# Patient Record
Sex: Male | Born: 1970 | Race: Black or African American | Hispanic: No | Smoking: Former smoker
Health system: Southern US, Community
[De-identification: ages and names within clinical notes are randomized; demographics above are authoritative.]

## PROBLEM LIST (undated history)

## (undated) DIAGNOSIS — W3400XA Accidental discharge from unspecified firearms or gun, initial encounter: Secondary | ICD-10-CM

## (undated) DIAGNOSIS — S81802A Unspecified open wound, left lower leg, initial encounter: Secondary | ICD-10-CM

## (undated) DIAGNOSIS — I10 Essential (primary) hypertension: Secondary | ICD-10-CM

## (undated) DIAGNOSIS — K219 Gastro-esophageal reflux disease without esophagitis: Secondary | ICD-10-CM

## (undated) DIAGNOSIS — G43909 Migraine, unspecified, not intractable, without status migrainosus: Secondary | ICD-10-CM

## (undated) DIAGNOSIS — G4730 Sleep apnea, unspecified: Secondary | ICD-10-CM

## (undated) HISTORY — PX: LEAD REMOVAL: SHX5944

---
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
## 2002-07-07 DIAGNOSIS — M25569 Pain in unspecified knee: Secondary | ICD-10-CM | POA: Insufficient documentation

## 2002-07-16 ENCOUNTER — Emergency Department (HOSPITAL_COMMUNITY): Admission: EM | Admit: 2002-07-16 | Discharge: 2002-07-16 | Payer: Self-pay | Admitting: Emergency Medicine

## 2003-07-08 ENCOUNTER — Emergency Department (HOSPITAL_COMMUNITY): Admission: EM | Admit: 2003-07-08 | Discharge: 2003-07-08 | Payer: Self-pay | Admitting: Emergency Medicine

## 2003-12-18 ENCOUNTER — Emergency Department (HOSPITAL_COMMUNITY): Admission: EM | Admit: 2003-12-18 | Discharge: 2003-12-18 | Payer: Self-pay | Admitting: Emergency Medicine

## 2005-03-10 ENCOUNTER — Emergency Department (HOSPITAL_COMMUNITY): Admission: EM | Admit: 2005-03-10 | Discharge: 2005-03-10 | Payer: Self-pay | Admitting: *Deleted

## 2005-10-09 ENCOUNTER — Emergency Department (HOSPITAL_COMMUNITY): Admission: EM | Admit: 2005-10-09 | Discharge: 2005-10-09 | Payer: Self-pay | Admitting: *Deleted

## 2005-11-30 ENCOUNTER — Emergency Department (HOSPITAL_COMMUNITY): Admission: EM | Admit: 2005-11-30 | Discharge: 2005-11-30 | Payer: Self-pay | Admitting: Emergency Medicine

## 2006-07-29 ENCOUNTER — Ambulatory Visit: Payer: Self-pay | Admitting: Internal Medicine

## 2006-08-05 ENCOUNTER — Ambulatory Visit: Payer: Self-pay | Admitting: Internal Medicine

## 2006-08-13 ENCOUNTER — Ambulatory Visit: Payer: Self-pay | Admitting: Internal Medicine

## 2006-09-03 ENCOUNTER — Ambulatory Visit: Payer: Self-pay | Admitting: Internal Medicine

## 2006-09-11 ENCOUNTER — Ambulatory Visit: Payer: Self-pay | Admitting: *Deleted

## 2006-11-17 ENCOUNTER — Ambulatory Visit: Payer: Self-pay | Admitting: Internal Medicine

## 2007-02-10 ENCOUNTER — Emergency Department (HOSPITAL_COMMUNITY): Admission: EM | Admit: 2007-02-10 | Discharge: 2007-02-10 | Payer: Self-pay | Admitting: Emergency Medicine

## 2007-03-18 ENCOUNTER — Telehealth (INDEPENDENT_AMBULATORY_CARE_PROVIDER_SITE_OTHER): Payer: Self-pay | Admitting: Internal Medicine

## 2007-03-24 ENCOUNTER — Encounter (INDEPENDENT_AMBULATORY_CARE_PROVIDER_SITE_OTHER): Payer: Self-pay | Admitting: *Deleted

## 2007-07-30 ENCOUNTER — Telehealth (INDEPENDENT_AMBULATORY_CARE_PROVIDER_SITE_OTHER): Payer: Self-pay | Admitting: Internal Medicine

## 2007-08-06 ENCOUNTER — Telehealth (INDEPENDENT_AMBULATORY_CARE_PROVIDER_SITE_OTHER): Payer: Self-pay | Admitting: Internal Medicine

## 2007-09-09 ENCOUNTER — Telehealth (INDEPENDENT_AMBULATORY_CARE_PROVIDER_SITE_OTHER): Payer: Self-pay | Admitting: Nurse Practitioner

## 2007-09-23 ENCOUNTER — Ambulatory Visit: Payer: Self-pay | Admitting: Internal Medicine

## 2007-09-23 DIAGNOSIS — G43909 Migraine, unspecified, not intractable, without status migrainosus: Secondary | ICD-10-CM | POA: Insufficient documentation

## 2007-09-23 DIAGNOSIS — M67919 Unspecified disorder of synovium and tendon, unspecified shoulder: Secondary | ICD-10-CM | POA: Insufficient documentation

## 2007-09-23 DIAGNOSIS — M719 Bursopathy, unspecified: Secondary | ICD-10-CM

## 2007-09-23 LAB — CONVERTED CEMR LAB
Bilirubin Urine: NEGATIVE
Ketones, urine, test strip: NEGATIVE
Urobilinogen, UA: 0.2
pH: 5.5

## 2007-09-27 ENCOUNTER — Telehealth (INDEPENDENT_AMBULATORY_CARE_PROVIDER_SITE_OTHER): Payer: Self-pay | Admitting: Internal Medicine

## 2007-09-27 ENCOUNTER — Encounter (INDEPENDENT_AMBULATORY_CARE_PROVIDER_SITE_OTHER): Payer: Self-pay | Admitting: *Deleted

## 2007-10-28 ENCOUNTER — Telehealth (INDEPENDENT_AMBULATORY_CARE_PROVIDER_SITE_OTHER): Payer: Self-pay | Admitting: Internal Medicine

## 2007-11-16 ENCOUNTER — Telehealth (INDEPENDENT_AMBULATORY_CARE_PROVIDER_SITE_OTHER): Payer: Self-pay | Admitting: *Deleted

## 2007-11-23 ENCOUNTER — Ambulatory Visit: Payer: Self-pay | Admitting: Internal Medicine

## 2007-11-24 ENCOUNTER — Encounter (INDEPENDENT_AMBULATORY_CARE_PROVIDER_SITE_OTHER): Payer: Self-pay | Admitting: Internal Medicine

## 2007-11-24 LAB — CONVERTED CEMR LAB
HDL: 51 mg/dL (ref >39)
LDL Cholesterol: 129 mg/dL — ABNORMAL HIGH (ref 0–99)

## 2007-11-25 ENCOUNTER — Encounter (INDEPENDENT_AMBULATORY_CARE_PROVIDER_SITE_OTHER): Payer: Self-pay | Admitting: Internal Medicine

## 2007-12-31 ENCOUNTER — Ambulatory Visit: Payer: Self-pay | Admitting: Internal Medicine

## 2007-12-31 DIAGNOSIS — E7800 Pure hypercholesterolemia, unspecified: Secondary | ICD-10-CM

## 2007-12-31 DIAGNOSIS — R5383 Other fatigue: Secondary | ICD-10-CM

## 2007-12-31 DIAGNOSIS — F341 Dysthymic disorder: Secondary | ICD-10-CM

## 2007-12-31 DIAGNOSIS — R0789 Other chest pain: Secondary | ICD-10-CM | POA: Insufficient documentation

## 2007-12-31 DIAGNOSIS — R5381 Other malaise: Secondary | ICD-10-CM

## 2007-12-31 DIAGNOSIS — B360 Pityriasis versicolor: Secondary | ICD-10-CM | POA: Insufficient documentation

## 2007-12-31 DIAGNOSIS — I517 Cardiomegaly: Secondary | ICD-10-CM

## 2008-01-04 ENCOUNTER — Encounter (INDEPENDENT_AMBULATORY_CARE_PROVIDER_SITE_OTHER): Payer: Self-pay | Admitting: Internal Medicine

## 2008-01-04 ENCOUNTER — Ambulatory Visit: Payer: Self-pay | Admitting: Internal Medicine

## 2008-01-04 ENCOUNTER — Ambulatory Visit (HOSPITAL_COMMUNITY): Admission: RE | Admit: 2008-01-04 | Discharge: 2008-01-04 | Payer: Self-pay | Admitting: Internal Medicine

## 2008-01-20 ENCOUNTER — Encounter (INDEPENDENT_AMBULATORY_CARE_PROVIDER_SITE_OTHER): Payer: Self-pay | Admitting: Internal Medicine

## 2008-03-09 ENCOUNTER — Encounter (INDEPENDENT_AMBULATORY_CARE_PROVIDER_SITE_OTHER): Payer: Self-pay | Admitting: *Deleted

## 2008-03-15 ENCOUNTER — Telehealth (INDEPENDENT_AMBULATORY_CARE_PROVIDER_SITE_OTHER): Payer: Self-pay | Admitting: Internal Medicine

## 2008-03-22 ENCOUNTER — Telehealth (INDEPENDENT_AMBULATORY_CARE_PROVIDER_SITE_OTHER): Payer: Self-pay | Admitting: Internal Medicine

## 2008-04-07 ENCOUNTER — Encounter (INDEPENDENT_AMBULATORY_CARE_PROVIDER_SITE_OTHER): Payer: Self-pay | Admitting: Internal Medicine

## 2008-06-06 ENCOUNTER — Ambulatory Visit: Payer: Self-pay | Admitting: Internal Medicine

## 2008-08-08 ENCOUNTER — Telehealth (INDEPENDENT_AMBULATORY_CARE_PROVIDER_SITE_OTHER): Payer: Self-pay | Admitting: Internal Medicine

## 2008-09-15 ENCOUNTER — Telehealth (INDEPENDENT_AMBULATORY_CARE_PROVIDER_SITE_OTHER): Payer: Self-pay | Admitting: Internal Medicine

## 2008-09-30 ENCOUNTER — Emergency Department (HOSPITAL_COMMUNITY): Admission: EM | Admit: 2008-09-30 | Discharge: 2008-09-30 | Payer: Self-pay | Admitting: Emergency Medicine

## 2009-03-26 ENCOUNTER — Emergency Department (HOSPITAL_COMMUNITY): Admission: EM | Admit: 2009-03-26 | Discharge: 2009-03-26 | Payer: Self-pay | Admitting: Emergency Medicine

## 2009-03-27 ENCOUNTER — Telehealth (INDEPENDENT_AMBULATORY_CARE_PROVIDER_SITE_OTHER): Payer: Self-pay | Admitting: *Deleted

## 2009-03-28 ENCOUNTER — Ambulatory Visit: Payer: Self-pay | Admitting: Internal Medicine

## 2009-03-28 LAB — CONVERTED CEMR LAB
AST: 17 units/L (ref 0–37)
Albumin: 4.5 g/dL (ref 3.5–5.2)
Alkaline Phosphatase: 53 units/L (ref 39–117)
BUN: 12 mg/dL (ref 6–23)
Basophils Absolute: 0 10*3/uL (ref 0.0–0.1)
Chloride: 105 meq/L (ref 96–112)
Creatinine, Ser: 0.93 mg/dL (ref 0.40–1.50)
HCT: 43.8 % (ref 39.0–52.0)
Hemoglobin: 14.9 g/dL (ref 13.0–17.0)
Lymphocytes Relative: 39 % (ref 12–46)
Lymphs Abs: 3 10*3/uL (ref 0.7–4.0)
MCHC: 34 g/dL (ref 30.0–36.0)
MCV: 91.8 fL (ref 78.0–100.0)
Monocytes Relative: 7 % (ref 3–12)
Neutro Abs: 4 10*3/uL (ref 1.7–7.7)
Neutrophils Relative %: 52 % (ref 43–77)
Platelets: 247 10*3/uL (ref 150–400)
Potassium: 4.5 meq/L (ref 3.5–5.3)
RBC: 4.77 M/uL (ref 4.22–5.81)
RDW: 13.2 % (ref 11.5–15.5)
Sodium: 142 meq/L (ref 135–145)

## 2009-04-17 ENCOUNTER — Encounter (INDEPENDENT_AMBULATORY_CARE_PROVIDER_SITE_OTHER): Payer: Self-pay | Admitting: Internal Medicine

## 2009-12-12 ENCOUNTER — Emergency Department (HOSPITAL_COMMUNITY): Admission: EM | Admit: 2009-12-12 | Discharge: 2009-12-12 | Payer: Self-pay | Admitting: Emergency Medicine

## 2010-05-29 ENCOUNTER — Encounter (INDEPENDENT_AMBULATORY_CARE_PROVIDER_SITE_OTHER): Payer: Self-pay | Admitting: Internal Medicine

## 2010-07-06 ENCOUNTER — Emergency Department (HOSPITAL_COMMUNITY)
Admission: EM | Admit: 2010-07-06 | Discharge: 2010-07-06 | Payer: Self-pay | Source: Home / Self Care | Admitting: Family Medicine

## 2010-08-04 LAB — CONVERTED CEMR LAB
Albumin: 4.5 g/dL (ref 3.5–5.2)
Alkaline Phosphatase: 56 units/L (ref 39–117)
Bilirubin Urine: NEGATIVE
Blood in Urine, dipstick: NEGATIVE
Chloride: 107 meq/L (ref 96–112)
Eosinophils Absolute: 0.1 10*3/uL (ref 0.0–0.7)
Eosinophils Relative: 2 % (ref 0–5)
Hemoglobin: 15 g/dL (ref 13.0–17.0)
Lymphs Abs: 2.7 10*3/uL (ref 0.7–4.0)
MCHC: 32.6 g/dL (ref 30.0–36.0)
Monocytes Absolute: 0.6 10*3/uL (ref 0.1–1.0)
Monocytes Relative: 9 % (ref 3–12)
Neutro Abs: 3 10*3/uL (ref 1.7–7.7)
Nitrite: NEGATIVE
Protein, U semiquant: NEGATIVE
RBC: 4.85 M/uL (ref 4.22–5.81)
Specific Gravity, Urine: >=1.03
TSH: 1.753 microintl units/mL (ref 0.350–5.50)
Total Bilirubin: 0.4 mg/dL (ref 0.3–1.2)
Total Protein: 7.3 g/dL (ref 6.0–8.3)
Urobilinogen, UA: 0.2
WBC Urine, dipstick: NEGATIVE

## 2010-08-06 NOTE — Letter (Signed)
Summary: FAXED REQUESTED RECORDS TO [facility]  FAXED REQUESTED RECORDS TO [facility]   Imported By: Arta Bruce 05/29/2010 16:42:45  _____________________________________________________________________  External Attachment:    Type:   Image     Comment:   External Document

## 2010-08-30 ENCOUNTER — Emergency Department (HOSPITAL_COMMUNITY)
Admission: EM | Admit: 2010-08-30 | Discharge: 2010-08-30 | Disposition: A | Discharge status: Hospice | Payer: Self-pay | Attending: Emergency Medicine | Admitting: Emergency Medicine

## 2010-08-30 ENCOUNTER — Emergency Department (HOSPITAL_COMMUNITY): Payer: Self-pay

## 2010-08-30 DIAGNOSIS — W108XXA Fall (on) (from) other stairs and steps, initial encounter: Secondary | ICD-10-CM | POA: Insufficient documentation

## 2010-08-30 DIAGNOSIS — R071 Chest pain on breathing: Secondary | ICD-10-CM | POA: Insufficient documentation

## 2010-08-30 DIAGNOSIS — S20219A Contusion of unspecified front wall of thorax, initial encounter: Secondary | ICD-10-CM | POA: Insufficient documentation

## 2010-08-30 DIAGNOSIS — S298XXA Other specified injuries of thorax, initial encounter: Secondary | ICD-10-CM | POA: Insufficient documentation

## 2010-08-30 DIAGNOSIS — F329 Major depressive disorder, single episode, unspecified: Secondary | ICD-10-CM | POA: Insufficient documentation

## 2010-08-30 DIAGNOSIS — Z79899 Other long term (current) drug therapy: Secondary | ICD-10-CM | POA: Insufficient documentation

## 2010-08-30 DIAGNOSIS — F3289 Other specified depressive episodes: Secondary | ICD-10-CM | POA: Insufficient documentation

## 2010-08-30 DIAGNOSIS — R0602 Shortness of breath: Secondary | ICD-10-CM | POA: Insufficient documentation

## 2010-08-30 DIAGNOSIS — I10 Essential (primary) hypertension: Secondary | ICD-10-CM | POA: Insufficient documentation

## 2010-09-23 LAB — GC/CHLAMYDIA PROBE AMP, GENITAL: Chlamydia, DNA Probe: POSITIVE — AB

## 2010-10-16 ENCOUNTER — Inpatient Hospital Stay (INDEPENDENT_AMBULATORY_CARE_PROVIDER_SITE_OTHER)
Admission: RE | Admit: 2010-10-16 | Discharge: 2010-10-16 | Disposition: A | Discharge status: Home / Self Care | Payer: Self-pay | Source: Ambulatory Visit | Attending: Family Medicine | Admitting: Family Medicine

## 2010-10-16 DIAGNOSIS — A64 Unspecified sexually transmitted disease: Secondary | ICD-10-CM

## 2010-10-16 DIAGNOSIS — I10 Essential (primary) hypertension: Secondary | ICD-10-CM

## 2010-10-17 LAB — POCT I-STAT, CHEM 8
BUN: 14 mg/dL (ref 6–23)
Calcium, Ion: 1.13 mmol/L (ref 1.12–1.32)
Glucose, Bld: 86 mg/dL (ref 70–99)
HCT: 47 % (ref 39.0–52.0)
TCO2: 26 mmol/L (ref 0–100)

## 2010-10-17 LAB — URINALYSIS, ROUTINE W REFLEX MICROSCOPIC
Hgb urine dipstick: NEGATIVE
Ketones, ur: 15 mg/dL — AB
Protein, ur: NEGATIVE mg/dL
Urobilinogen, UA: 1 mg/dL (ref 0.0–1.0)
pH: 5.5 (ref 5.0–8.0)

## 2010-10-17 LAB — CBC
MCV: 94.4 fL (ref 78.0–100.0)
Platelets: 220 10*3/uL (ref 150–400)
RDW: 13 % (ref 11.5–15.5)
WBC: 16.6 10*3/uL — ABNORMAL HIGH (ref 4.0–10.5)

## 2010-10-17 LAB — DIFFERENTIAL
Basophils Relative: 1 % (ref 0–1)
Lymphocytes Relative: 15 % (ref 12–46)
Lymphs Abs: 2.5 10*3/uL (ref 0.7–4.0)
Neutro Abs: 12.9 10*3/uL — ABNORMAL HIGH (ref 1.7–7.7)
Neutrophils Relative %: 78 % — ABNORMAL HIGH (ref 43–77)

## 2010-10-17 LAB — GC/CHLAMYDIA PROBE AMP, GENITAL: GC Probe Amp, Genital: NEGATIVE

## 2010-12-19 ENCOUNTER — Emergency Department (HOSPITAL_COMMUNITY)
Admission: EM | Admit: 2010-12-19 | Discharge: 2010-12-19 | Discharge status: Against Medical Advice | Payer: Self-pay | Attending: Emergency Medicine | Admitting: Emergency Medicine

## 2010-12-19 DIAGNOSIS — Z0389 Encounter for observation for other suspected diseases and conditions ruled out: Secondary | ICD-10-CM | POA: Insufficient documentation

## 2011-03-04 ENCOUNTER — Emergency Department (HOSPITAL_COMMUNITY): Payer: Self-pay

## 2011-03-04 ENCOUNTER — Emergency Department (HOSPITAL_COMMUNITY)
Admission: EM | Admit: 2011-03-04 | Discharge: 2011-03-04 | Disposition: A | Discharge status: Home / Self Care | Payer: Self-pay | Attending: Emergency Medicine | Admitting: Emergency Medicine

## 2011-03-04 DIAGNOSIS — I10 Essential (primary) hypertension: Secondary | ICD-10-CM | POA: Insufficient documentation

## 2011-03-04 DIAGNOSIS — R079 Chest pain, unspecified: Secondary | ICD-10-CM | POA: Insufficient documentation

## 2011-03-04 DIAGNOSIS — R51 Headache: Secondary | ICD-10-CM | POA: Insufficient documentation

## 2011-03-04 LAB — POCT I-STAT, CHEM 8
BUN: 14 mg/dL (ref 6–23)
Glucose, Bld: 75 mg/dL (ref 70–99)
HCT: 45 % (ref 39.0–52.0)
Hemoglobin: 15.3 g/dL (ref 13.0–17.0)

## 2011-07-07 ENCOUNTER — Encounter: Payer: Self-pay | Admitting: Emergency Medicine

## 2011-07-07 ENCOUNTER — Emergency Department (HOSPITAL_COMMUNITY)
Admission: EM | Admit: 2011-07-07 | Discharge: 2011-07-07 | Disposition: A | Discharge status: Home / Self Care | Payer: Self-pay | Attending: Emergency Medicine | Admitting: Emergency Medicine

## 2011-07-07 DIAGNOSIS — F17200 Nicotine dependence, unspecified, uncomplicated: Secondary | ICD-10-CM | POA: Insufficient documentation

## 2011-07-07 DIAGNOSIS — R51 Headache: Secondary | ICD-10-CM | POA: Insufficient documentation

## 2011-07-07 DIAGNOSIS — R112 Nausea with vomiting, unspecified: Secondary | ICD-10-CM | POA: Insufficient documentation

## 2011-07-07 HISTORY — DX: Migraine, unspecified, not intractable, without status migrainosus: G43.909

## 2011-07-07 MED ORDER — SODIUM CHLORIDE 0.9 % IV BOLUS (SEPSIS)
500.0000 mL | Freq: Once | INTRAVENOUS | Status: AC
  Administered 2011-07-07: 500 mL via INTRAVENOUS

## 2011-07-07 MED ORDER — METOCLOPRAMIDE HCL 5 MG/ML IJ SOLN
10.0000 mg | Freq: Once | INTRAMUSCULAR | Status: AC
  Administered 2011-07-07: 10 mg via INTRAVENOUS
  Filled 2011-07-07: qty 2

## 2011-07-07 MED ORDER — DEXAMETHASONE SODIUM PHOSPHATE 10 MG/ML IJ SOLN
10.0000 mg | Freq: Once | INTRAMUSCULAR | Status: AC
  Administered 2011-07-07: 10 mg via INTRAVENOUS
  Filled 2011-07-07: qty 1

## 2011-07-07 MED ORDER — VERAPAMIL HCL ER 120 MG PO TBCR: 120.0000 mg | EXTENDED_RELEASE_TABLET | Freq: Every day | ORAL | Status: DC

## 2011-07-07 MED ORDER — DIPHENHYDRAMINE HCL 50 MG/ML IJ SOLN
25.0000 mg | Freq: Once | INTRAMUSCULAR | Status: AC
  Administered 2011-07-07: 25 mg via INTRAVENOUS
  Filled 2011-07-07: qty 1

## 2011-07-07 NOTE — ED Provider Notes (Signed)
History     CSN: [id number]  Arrival date & time 07/07/11  0156   First MD Initiated Contact with Patient 07/07/11 0250      Chief Complaint  Patient presents with  . Headache     Patient is a 40 y.o. male presenting with headaches. The history is provided by the patient.  Headache  This is a recurrent problem. The current episode started 6 to 12 hours ago. The problem occurs constantly. The problem has been gradually worsening. The headache is associated with nothing. The pain is located in the frontal region. The quality of the pain is described as throbbing. The pain is moderate. Associated symptoms include nausea and vomiting. Pertinent negatives include no fever and no syncope.  HA was not sudden onset Similar to prior episodes of migraines No fever No focal weakness No cp No visual/hearing loss No head/neck injury No one else at home has these symptoms  Past Medical History  Diagnosis Date  . Migraines   . Cancer     No past surgical history on file.  History reviewed. No pertinent family history.  History  Substance Use Topics  . Smoking status: Current Everyday Smoker  . Smokeless tobacco: Not on file  . Alcohol Use: Yes      Review of Systems  Constitutional: Negative for fever.  Cardiovascular: Negative for syncope.  Gastrointestinal: Positive for nausea and vomiting.  Neurological: Positive for headaches.  All other systems reviewed and are negative.    Allergies  Penicillins  Home Medications  No current outpatient prescriptions on file.  BP 141/73  Pulse 79  Temp(Src) 97.8 F (36.6 C) (Oral)  Resp 17  Ht 5\' 6"  (1.676 m)  Wt 165 lb (74.844 kg)  BMI 26.63 kg/m2  SpO2 100%  Physical Exam CONSTITUTIONAL: Well developed/well nourished HEAD AND FACE: Normocephalic/atraumatic EYES: EOMI/PERRL, normal appearance ENMT: Mucous membranes moist NECK: supple no meningeal signs, no bruits SPINE:entire spine nontender CV: S1/S2 noted, no  murmurs/rubs/gallops noted LUNGS: Lungs are clear to auscultation bilaterally, no apparent distress ABDOMEN: soft, nontender, no rebound or guarding GU:no cva tenderness NEURO: Awake/alert, facies symmetric, no arm or leg drift is noted Cranial nerves 3/4/5/6/01/12/09/11/12 tested and intact Gait normal No pastpointing EXTREMITIES: pulses normal, full ROM SKIN: warm, color normal PSYCH: no abnormalities of mood noted  ED Course  Procedures     MDM  Nursing notes reviewed and considered in documentation Previous records reviewed and considered  Pt has had negative CT head previously Doubt SAH or other acute neurologic event He is nontoxic in appearance  4:27 AM Pt improved, stable for d/c      Joya Gaskins, MD 07/07/11 [phone]

## 2011-07-07 NOTE — ED Notes (Signed)
Patient states that at yesterday at 3 he started having a headache and vomiting x3 also states no BM in approx 2 weeks rates 8/10

## 2012-03-01 ENCOUNTER — Emergency Department (HOSPITAL_COMMUNITY)
Admission: EM | Admit: 2012-03-01 | Discharge: 2012-03-01 | Disposition: A | Discharge status: Home / Self Care | Payer: Self-pay | Attending: Emergency Medicine | Admitting: Emergency Medicine

## 2012-03-01 ENCOUNTER — Encounter (HOSPITAL_COMMUNITY): Payer: Self-pay | Admitting: Emergency Medicine

## 2012-03-01 DIAGNOSIS — F17200 Nicotine dependence, unspecified, uncomplicated: Secondary | ICD-10-CM | POA: Insufficient documentation

## 2012-03-01 DIAGNOSIS — I10 Essential (primary) hypertension: Secondary | ICD-10-CM | POA: Insufficient documentation

## 2012-03-01 DIAGNOSIS — L089 Local infection of the skin and subcutaneous tissue, unspecified: Secondary | ICD-10-CM | POA: Insufficient documentation

## 2012-03-01 HISTORY — DX: Essential (primary) hypertension: I10

## 2012-03-01 MED ORDER — SULFAMETHOXAZOLE-TRIMETHOPRIM 800-160 MG PO TABS: 2.0000 | ORAL_TABLET | Freq: Two times a day (BID) | ORAL | Status: AC

## 2012-03-01 MED ORDER — IBUPROFEN 800 MG PO TABS: 800.0000 mg | ORAL_TABLET | Freq: Three times a day (TID) | ORAL | Status: AC | PRN

## 2012-03-01 NOTE — ED Provider Notes (Signed)
Medical screening examination/treatment/procedure(s) were performed by non-physician practitioner and as supervising physician I was immediately available for consultation/collaboration.   Gavin Pound. Erland Vivas, MD 03/01/12 1517

## 2012-03-01 NOTE — ED Provider Notes (Signed)
History     CSN: [id number]  Arrival date & time 03/01/12  1020   First MD Initiated Contact with Patient 03/01/12 1106      Chief Complaint  Patient presents with  . Toe Injury    (Consider location/radiation/quality/duration/timing/severity/associated sxs/prior treatment) HPI Comments: Patient reports he was at a water park 2 days ago and was sliding down a water slide and his right second toenail got caught and was lifted up, partially separating from his toe.   States that he pushed the toenail down, cleaned the toe with iodine, and wrapped it up.  States it began throbbing yesterday and pus started coming out from under the toenail last night.  Pain is exacerbated by ambulation and palpation.  States he took [facility] powders for the pain but does not know if it helped because he was also drinking alcohol.  Denies fevers, chills, leg swelling.  Denies other injury.  No weakness or numbness of the toe.  Pt is not diabetic, is not immunocompromised.    The history is provided by the patient.    Past Medical History  Diagnosis Date  . Migraines   . Cancer   . Hypertension     History reviewed. No pertinent past surgical history.  History reviewed. No pertinent family history.  History  Substance Use Topics  . Smoking status: Current Everyday Smoker  . Smokeless tobacco: Not on file  . Alcohol Use: Yes      Review of Systems  Constitutional: Negative for fever and chills.  Skin: Positive for wound.  Neurological: Negative for weakness and numbness.    Allergies  Penicillins  Home Medications   Current Outpatient Rx  Name Route Sig Dispense Refill  . OVER THE COUNTER MEDICATION Oral Take 1 Package by mouth once as needed. For pain  BC Powder      BP 142/89  Pulse 80  Temp 97.7 F (36.5 C) (Oral)  Resp 16  SpO2 96%  Physical Exam  Nursing note and vitals reviewed. Constitutional: He appears well-developed and well-nourished. No distress.  HENT:  Head:  Normocephalic and atraumatic.  Neck: Neck supple.  Pulmonary/Chest: Effort normal.  Musculoskeletal:       Feet:  Neurological: He is alert.  Skin: He is not diaphoretic.    ED Course  Procedures (including critical care time)  Labs Reviewed - No data to display No results found.   1. Toe infection       MDM  Pt with infection involving right second toe and toenail.  Pt is afebrile, nontoxic, no areas of fluctuance within toe.  Toenail is attached, no subungual hematoma.  Pt is immunocompetent.  Chart initially noted Cancer as part of medical history - patient denies this.  Pt d/c home with bactrim, 2 day recheck.  Discussed diagnosis, wound care, and follow up with patient.  Pt given return precautions.  Pt verbalizes understanding and agrees with plan.           [place], [place] 03/01/12 1219

## 2012-03-01 NOTE — ED Notes (Signed)
Pt c/o right second toe injury where toe nail lifted up over the weekend; pt sts purulent discharge coming from toe now

## 2012-03-01 NOTE — ED Notes (Signed)
Reports was caught toe on something going down a waterslide on Saturday & that his toe nail came off. States placed it back on. Edema & bruising noted

## 2012-04-13 ENCOUNTER — Encounter (HOSPITAL_COMMUNITY): Payer: Self-pay

## 2012-04-13 ENCOUNTER — Emergency Department (HOSPITAL_COMMUNITY)
Admission: EM | Admit: 2012-04-13 | Discharge: 2012-04-13 | Disposition: A | Discharge status: Home / Self Care | Payer: Self-pay | Attending: Emergency Medicine | Admitting: Emergency Medicine

## 2012-04-13 DIAGNOSIS — R51 Headache: Secondary | ICD-10-CM | POA: Insufficient documentation

## 2012-04-13 NOTE — ED Notes (Signed)
Called for pt x1 at 1530

## 2012-04-13 NOTE — ED Notes (Signed)
Pt complains of headache for 4 days, and body feels tired.

## 2012-04-13 NOTE — ED Notes (Signed)
Called for pt x2 at 1600. No answer.

## 2012-04-14 ENCOUNTER — Emergency Department (INDEPENDENT_AMBULATORY_CARE_PROVIDER_SITE_OTHER)
Admission: EM | Admit: 2012-04-14 | Discharge: 2012-04-14 | Discharge status: Against Medical Advice | Payer: Self-pay | Source: Home / Self Care | Attending: Emergency Medicine | Admitting: Emergency Medicine

## 2012-04-14 ENCOUNTER — Encounter (HOSPITAL_COMMUNITY): Payer: Self-pay | Admitting: Emergency Medicine

## 2012-04-14 DIAGNOSIS — I10 Essential (primary) hypertension: Secondary | ICD-10-CM | POA: Insufficient documentation

## 2012-04-14 DIAGNOSIS — R51 Headache: Secondary | ICD-10-CM

## 2012-04-14 DIAGNOSIS — R0602 Shortness of breath: Secondary | ICD-10-CM | POA: Insufficient documentation

## 2012-04-14 NOTE — ED Notes (Signed)
Patient complaining of hypertension, headache, and shortness of breath the last several days.  Patient reports that he came to the ED several days ago, but left AMA.  Patient was seen at Urgent Care tonight; Urgent Care wanted patient to come to ED, but patient refused.  Patient's family talked him into coming back to ED.  Patient has history of hypertension; patient reports that he has not had his blood pressure medication for the last year due to no insurance.  Denies dizziness and blurred vision.

## 2012-04-14 NOTE — ED Provider Notes (Signed)
History     CSN: [id number]  Arrival date & time 04/14/12  1903   First MD Initiated Contact with Patient 04/14/12 2010      No chief complaint on file.   (Consider location/radiation/quality/duration/timing/severity/associated sxs/prior treatment) The history is provided by the patient.  Patient complains of a 2 week history of persistent headache. Character:  Throbbing Location: behind eyes Aggravating activities:  Standing, walking Alleviating activities:  Supine, quiet No history of injury.  Not worst headache of life.  + awaken from sleep.  No radiation, +dizziness, -syncope, -LOC, +diplopia, loss of vision, aura, +photophobia. +nausea, no vomiting.  No extremity weakness, numbness, or paresthesias Known history of both migraines and hypertension.  Does not take medication for either for over one year.  Home migraine medication taken with minimal improvement. Headache at worst 8/10 decreased to 5/10. Past Medical History  Diagnosis Date  . Migraines   . Hypertension     No past surgical history on file.  No family history on file.  History  Substance Use Topics  . Smoking status: Current Every Day Smoker  . Smokeless tobacco: Not on file  . Alcohol Use: Yes      Review of Systems  Constitutional: Negative.   Eyes: Positive for photophobia and visual disturbance.  Respiratory: Positive for shortness of breath.   Cardiovascular: Positive for palpitations.  Gastrointestinal: Positive for nausea.  Genitourinary: Negative.   Musculoskeletal: Negative.   Skin: Negative.   Neurological: Positive for dizziness and headaches. Negative for syncope, speech difficulty, weakness and numbness.    Allergies  Penicillins  Home Medications    BP 153/108  Pulse 68  Temp 98.6 F (37 C) (Oral)  Resp 17  SpO2 100%  Physical Exam  Nursing note and vitals reviewed. Constitutional: He is oriented to person, place, and time. Vital signs are normal. He appears  well-developed and well-nourished. He is active and cooperative.  HENT:  Head: Normocephalic.  Right Ear: Hearing, tympanic membrane, external ear and ear canal normal.  Left Ear: Hearing, tympanic membrane, external ear and ear canal normal.  Nose: Nose normal. Right sinus exhibits no maxillary sinus tenderness and no frontal sinus tenderness. Left sinus exhibits no maxillary sinus tenderness and no frontal sinus tenderness.  Mouth/Throat: Uvula is midline, oropharynx is clear and moist and mucous membranes are normal. No oropharyngeal exudate.  Eyes: Conjunctivae normal and EOM are normal. Pupils are equal, round, and reactive to light. Right eye exhibits no chemosis, no discharge, no exudate and no hordeolum. No foreign body present in the right eye. Left eye exhibits no chemosis, no discharge, no exudate and no hordeolum. No foreign body present in the left eye. No scleral icterus.       Painful EOM, no pastpointing  Neck: Trachea normal, normal range of motion and full passive range of motion without pain. Neck supple. No spinous process tenderness and no muscular tenderness present. Carotid bruit is not present. No thyromegaly present.  Cardiovascular: Normal rate, regular rhythm, S1 normal, normal heart sounds, intact distal pulses and normal pulses.   Pulmonary/Chest: Effort normal and breath sounds normal.  Abdominal: Soft. Normal appearance and bowel sounds are normal. There is no tenderness. There is no rebound and no CVA tenderness.  Musculoskeletal: Normal range of motion.  Lymphadenopathy:       Head (right side): No submental, no submandibular, no tonsillar, no preauricular, no posterior auricular and no occipital adenopathy present.       Head (left side): No submental, no  submandibular, no tonsillar, no preauricular, no posterior auricular and no occipital adenopathy present.    He has no cervical adenopathy.  Neurological: He is alert and oriented to person, place, and time. He  has normal strength and normal reflexes. No cranial nerve deficit or sensory deficit. Coordination and gait normal. GCS eye subscore is 4. GCS verbal subscore is 5. GCS motor subscore is 6.  Skin: Skin is warm and dry.  Psychiatric: He has a normal mood and affect. His speech is normal and behavior is normal. Judgment and thought content normal. Cognition and memory are normal.    ED Course  Procedures (including critical care time)  Labs Reviewed - No data to display   1. Headache   2. Hypertension       MDM  Transfer to [facility] for further evaluation and treatment of headache and further evaluation elevated htn. Patient does not have primary care provider.  Pt refused transfer, reports he does not wish to go to ER for long wait.        Johnsie Kindred, NP 04/15/12 [phone]

## 2012-04-14 NOTE — ED Notes (Signed)
NP states pt. left approx. 2035.

## 2012-04-14 NOTE — ED Notes (Addendum)
NP in room with pt. when I went to assess them.  When I came back now, pt. is not in room.  I was told pt. refused to go to the ED as the NP suggested.   I was told he became upset. He wanted prescriptions for his high blood pressure. Pt. Left AMA.

## 2012-04-15 ENCOUNTER — Emergency Department (HOSPITAL_COMMUNITY): Payer: Self-pay

## 2012-04-15 ENCOUNTER — Emergency Department (HOSPITAL_COMMUNITY)
Admission: EM | Admit: 2012-04-15 | Discharge: 2012-04-15 | Disposition: A | Discharge status: Home / Self Care | Payer: Self-pay | Attending: Emergency Medicine | Admitting: Emergency Medicine

## 2012-04-15 DIAGNOSIS — R51 Headache: Secondary | ICD-10-CM

## 2012-04-15 DIAGNOSIS — I10 Essential (primary) hypertension: Secondary | ICD-10-CM

## 2012-04-15 DIAGNOSIS — R0602 Shortness of breath: Secondary | ICD-10-CM

## 2012-04-15 LAB — COMPREHENSIVE METABOLIC PANEL
ALT: 17 U/L (ref 0–53)
AST: 21 U/L (ref 0–37)
Albumin: 4.7 g/dL (ref 3.5–5.2)
Alkaline Phosphatase: 60 U/L (ref 39–117)
BUN: 11 mg/dL (ref 6–23)
CO2: 25 mEq/L (ref 19–32)
Calcium: 10.2 mg/dL (ref 8.4–10.5)
Chloride: 101 mEq/L (ref 96–112)
Creatinine, Ser: 0.97 mg/dL (ref 0.50–1.35)
GFR calc Af Amer: >90 mL/min (ref >90)
GFR calc non Af Amer: >90 mL/min (ref >90)
Glucose, Bld: 90 mg/dL (ref 70–99)
Potassium: 3.3 mEq/L — ABNORMAL LOW (ref 3.5–5.1)
Sodium: 138 mEq/L (ref 135–145)
Total Bilirubin: 0.4 mg/dL (ref 0.3–1.2)
Total Protein: 8 g/dL (ref 6.0–8.3)

## 2012-04-15 LAB — CBC WITH DIFFERENTIAL/PLATELET
Basophils Absolute: 0 10*3/uL (ref 0.0–0.1)
Basophils Relative: 0 % (ref 0–1)
Eosinophils Absolute: 0.2 10*3/uL (ref 0.0–0.7)
Eosinophils Relative: 2 % (ref 0–5)
HCT: 46 % (ref 39.0–52.0)
Hemoglobin: 16.2 g/dL (ref 13.0–17.0)
Lymphocytes Relative: 42 % (ref 12–46)
Lymphs Abs: 3.8 10*3/uL (ref 0.7–4.0)
MCH: 31.8 pg (ref 26.0–34.0)
MCHC: 35.2 g/dL (ref 30.0–36.0)
MCV: 90.4 fL (ref 78.0–100.0)
Monocytes Absolute: 0.8 10*3/uL (ref 0.1–1.0)
Monocytes Relative: 9 % (ref 3–12)
Neutro Abs: 4.3 10*3/uL (ref 1.7–7.7)
Neutrophils Relative %: 48 % (ref 43–77)
Platelets: 233 10*3/uL (ref 150–400)
RBC: 5.09 MIL/uL (ref 4.22–5.81)
RDW: 12.5 % (ref 11.5–15.5)
WBC: 9.1 10*3/uL (ref 4.0–10.5)

## 2012-04-15 LAB — POCT I-STAT TROPONIN I: Troponin i, poc: 0.01 ng/mL (ref 0.00–0.08)

## 2012-04-15 MED ORDER — METOCLOPRAMIDE HCL 5 MG/ML IJ SOLN: 10.0000 mg | Freq: Once | INTRAMUSCULAR | Status: DC

## 2012-04-15 MED ORDER — HYDROCHLOROTHIAZIDE 25 MG PO TABS: 25.0000 mg | ORAL_TABLET | Freq: Every day | ORAL | Status: DC

## 2012-04-15 MED ORDER — PROMETHAZINE HCL 25 MG/ML IJ SOLN
25.0000 mg | Freq: Once | INTRAMUSCULAR | Status: AC
  Administered 2012-04-15: 25 mg via INTRAMUSCULAR
  Filled 2012-04-15: qty 1

## 2012-04-15 MED ORDER — PROMETHAZINE HCL 25 MG PO TABS: 25.0000 mg | ORAL_TABLET | Freq: Four times a day (QID) | ORAL | Status: DC | PRN

## 2012-04-15 NOTE — ED Notes (Signed)
HA posterior head with  Light sensitivity.

## 2012-04-15 NOTE — ED Provider Notes (Signed)
History     CSN: [id number]  Arrival date & time 04/14/12  2316   First MD Initiated Contact with Patient 04/15/12 0101      Chief Complaint  Patient presents with  . Hypertension    (Consider location/radiation/quality/duration/timing/severity/associated sxs/prior treatment) HPI History provided by pt.   Pt has h/o HTN and has not taken his anti-hypertensive in over a year d/t finances.  Presents w/ 2 weeks of intermittent, throbbing, migrating headaches that he attributes to high blood pressure.  Pain is associated w/ left-sided blurred vision, nausea and photo/phonophoia.  Has been officially diagnosed and treated for migraines in the past, and some of these recent headaches have felt similar to a migraine.  Denies fever and other neurologic deficits.  Also c/o intermittent SOB for the past 1.5wks.  Non-exertional.  Lasts 1-2 minutes and then resolves spontaneously.  Has had an occasional pain in right lateral chest but otherwise no CP and denies cough and wheezing.  No RF for PE and denies LE pain/edema.  Does not smoke cigarettes.      Past Medical History  Diagnosis Date  . Migraines   . Hypertension     History reviewed. No pertinent past surgical history.  History reviewed. No pertinent family history.  History  Substance Use Topics  . Smoking status: Current Every Day Smoker  . Smokeless tobacco: Not on file  . Alcohol Use: Yes      Review of Systems  All other systems reviewed and are negative.    Allergies  Penicillins  Home Medications   Current Outpatient Rx  Name Route Sig Dispense Refill  . IBUPROFEN 200 MG PO TABS Oral Take 800 mg by mouth every 6 (six) hours as needed. For pain    . OVER THE COUNTER MEDICATION Oral Take 3 tablets by mouth 3 (three) times daily as needed. For migraines      BP 140/100  Pulse 86  Temp 98.1 F (36.7 C) (Oral)  Resp 18  SpO2 98%  Physical Exam  Nursing note and vitals reviewed. Constitutional: He is oriented  to person, place, and time. He appears well-developed and well-nourished. No distress.       Pt appears comfortable.  HENT:  Head: Normocephalic and atraumatic.  Eyes:       Normal appearance  Neck: Normal range of motion.       No meningeal signs  Cardiovascular: Normal rate, regular rhythm and intact distal pulses.        BP 140/100  Pulmonary/Chest: Effort normal and breath sounds normal.  Musculoskeletal: Normal range of motion.  Neurological: He is alert and oriented to person, place, and time. He displays no tremor. No sensory deficit. Coordination normal.       CN 3-12 intact.  5/5 and equal upper and lower extremity strength.  No past pointing. No nystagmus.   Skin: Skin is warm and dry. No rash noted.  Psychiatric: He has a normal mood and affect. His behavior is normal.    ED Course  Procedures (including critical care time)   Date: 04/15/2012  Rate: 61  Rhythm: normal sinus rhythm  QRS Axis: right  Intervals: normal  ST/T Wave abnormalities: normal  Conduction Disutrbances:borderline AV conduction delay  Narrative Interpretation:   Old EKG Reviewed: unchanged   Labs Reviewed  COMPREHENSIVE METABOLIC PANEL - Abnormal; Notable for the following:    Potassium 3.3 (*)     All other components within normal limits  CBC WITH DIFFERENTIAL  POCT I-STAT  TROPONIN I   Dg Chest 2 View  04/15/2012  *RADIOLOGY REPORT*  Clinical Data: Chest pain, shortness of breath, hypertension  CHEST - 2 VIEW  Comparison: 03/04/2011; 08/30/2010  Findings: Grossly unchanged cardiac silhouette and mediastinal contours.  No focal parenchymal opacities.  No pleural effusion or pneumothorax.  Unchanged bones.  IMPRESSION: No acute cardiopulmonary disease.   Original Report Authenticated By: Waynard Reeds, M.D.      1. Hypertension   2. Headache   3. Shortness of breath       MDM  41yo M w/ h/o HTN (untreated x 5yr) and migraine, otherwise healthy, presents w/ 2 weeks intermittent,  non-traumatic headache, some of which have felt similar to past migraines.  Also c/o non-exertional SOB.  Low risk ACS and no RF for PE. On exam, mildly hypertensive, well-appearing, afebrile, no focal neuro deficits or meningeal signs, no signs of DVT.  EKG and CXR pending.  Pt has received 25mg  IM phenergan for headache.  Will reassess shortly and if pain improved, d/c home w/ po phenergan (for nausea associated w/ headaches), hctz and referral to [facility].  Pt understands the importance of blood pressure control. Will instruct to return for worsening headache or SOB, fever or CP.  2:09 AM         Otilio Miu, PA 04/15/12 [phone]

## 2012-04-16 NOTE — ED Provider Notes (Signed)
Medical screening examination/treatment/procedure(s) were performed by non-physician practitioner and as supervising physician I was immediately available for consultation/collaboration.  Shalicia Craghead, MD 04/16/12 0710 

## 2012-04-16 NOTE — ED Provider Notes (Signed)
Medical screening examination/treatment/procedure(s) were performed by non-physician practitioner and as supervising physician I was immediately available for consultation/collaboration.  Leslee Home, M.D.   Reuben Likes, MD 04/16/12 2114

## 2012-12-11 ENCOUNTER — Encounter (HOSPITAL_COMMUNITY): Payer: Self-pay | Admitting: Physical Medicine and Rehabilitation

## 2012-12-11 ENCOUNTER — Emergency Department (HOSPITAL_COMMUNITY)
Admission: EM | Admit: 2012-12-11 | Discharge: 2012-12-11 | Disposition: A | Discharge status: Home / Self Care | Payer: Self-pay | Attending: Emergency Medicine | Admitting: Emergency Medicine

## 2012-12-11 DIAGNOSIS — I10 Essential (primary) hypertension: Secondary | ICD-10-CM | POA: Insufficient documentation

## 2012-12-11 DIAGNOSIS — Y929 Unspecified place or not applicable: Secondary | ICD-10-CM | POA: Insufficient documentation

## 2012-12-11 DIAGNOSIS — F17200 Nicotine dependence, unspecified, uncomplicated: Secondary | ICD-10-CM | POA: Insufficient documentation

## 2012-12-11 DIAGNOSIS — S139XXA Sprain of joints and ligaments of unspecified parts of neck, initial encounter: Secondary | ICD-10-CM | POA: Insufficient documentation

## 2012-12-11 DIAGNOSIS — M436 Torticollis: Secondary | ICD-10-CM | POA: Insufficient documentation

## 2012-12-11 DIAGNOSIS — Y9389 Activity, other specified: Secondary | ICD-10-CM | POA: Insufficient documentation

## 2012-12-11 DIAGNOSIS — X500XXA Overexertion from strenuous movement or load, initial encounter: Secondary | ICD-10-CM | POA: Insufficient documentation

## 2012-12-11 DIAGNOSIS — Z880 Allergy status to penicillin: Secondary | ICD-10-CM | POA: Insufficient documentation

## 2012-12-11 DIAGNOSIS — Z8679 Personal history of other diseases of the circulatory system: Secondary | ICD-10-CM | POA: Insufficient documentation

## 2012-12-11 DIAGNOSIS — S161XXA Strain of muscle, fascia and tendon at neck level, initial encounter: Secondary | ICD-10-CM

## 2012-12-11 DIAGNOSIS — IMO0002 Reserved for concepts with insufficient information to code with codable children: Secondary | ICD-10-CM | POA: Insufficient documentation

## 2012-12-11 HISTORY — DX: Accidental discharge from unspecified firearms or gun, initial encounter: W34.00XA

## 2012-12-11 HISTORY — DX: Unspecified open wound, left lower leg, initial encounter: S81.802A

## 2012-12-11 MED ORDER — IBUPROFEN 600 MG PO TABS: 600.0000 mg | ORAL_TABLET | Freq: Four times a day (QID) | ORAL | Status: DC | PRN

## 2012-12-11 MED ORDER — METHOCARBAMOL 500 MG PO TABS: 500.0000 mg | ORAL_TABLET | Freq: Two times a day (BID) | ORAL | Status: DC

## 2012-12-11 NOTE — ED Provider Notes (Signed)
Medical screening examination/treatment/procedure(s) were performed by non-physician practitioner and as supervising physician I was immediately available for consultation/collaboration.   Shalawn Wynder, MD 12/11/12 1540 

## 2012-12-11 NOTE — ED Notes (Signed)
Pt presents to department for evaluation of neck and upper back pain. States he lifted heavy objects yesterday, pain started this morning. 8/10 pain that becomes worse with movement. Pt is alert and oriented x4. Ambulatory to exam room.

## 2012-12-11 NOTE — ED Provider Notes (Signed)
History     CSN: [id number]  Arrival date & time 12/11/12  1345   First MD Initiated Contact with Patient 12/11/12 1356      Chief Complaint  Patient presents with  . Neck Pain  . Back Pain    (Consider location/radiation/quality/duration/timing/severity/associated sxs/prior treatment) HPI  42 year old male presents complaining of neck and upper back pain. Patient reports he was lifting a heavy object yesterday when he experienced a sudden onset of sharp throbbing pain to his neck and upper back. Pain is rated as 8 out 10, worsening with movement.  He continues on with the day, did have to carry his son's stroller out of the car, and also report having "crazy sex" last night.  This morning the neck pain returns.  Pain is sharp, worsening with neck movement, improves if he keeps it in one position.  Denies fever, headache, cp, sob, or rash.  No IV drug use.  NO specific treatment tried.  Pt has to perform heavy lifting at work and requesting work note.    Past Medical History  Diagnosis Date  . Migraines   . Hypertension     No past surgical history on file.  History reviewed. No pertinent family history.  History  Substance Use Topics  . Smoking status: Current Every Day Smoker    Types: Cigarettes  . Smokeless tobacco: Not on file  . Alcohol Use: Yes     Comment: social      Review of Systems  Constitutional: Negative for fever.  HENT: Positive for neck pain and neck stiffness. Negative for congestion, rhinorrhea and dental problem.   Eyes: Negative for pain.  Respiratory: Negative for chest tightness.   Cardiovascular: Negative for chest pain.  Gastrointestinal: Negative for abdominal pain.  Skin: Negative for rash and wound.  Neurological: Negative for headaches.    Allergies  Penicillins  Home Medications   Current Outpatient Rx  Name  Route  Sig  Dispense  Refill  . hydrochlorothiazide (HYDRODIURIL) 25 MG tablet   Oral   Take 1 tablet (25 mg total) by  mouth daily.   30 tablet   0   . hydrochlorothiazide (MICROZIDE) 12.5 MG capsule   Oral   Take 12.5 mg by mouth daily.         Marland Kitchen OVER THE COUNTER MEDICATION   Oral   Take 3 tablets by mouth 3 (three) times daily as needed. For migraines         . promethazine (PHENERGAN) 25 MG tablet   Oral   Take 1 tablet (25 mg total) by mouth every 6 (six) hours as needed for nausea.   20 tablet   0     BP 137/86  Pulse 78  Temp(Src) 98.4 F (36.9 C) (Oral)  Resp 20  SpO2 97%  Physical Exam  Nursing note and vitals reviewed. Constitutional: He appears well-developed and well-nourished. No distress.  HENT:  Head: Normocephalic and atraumatic.  Eyes: Conjunctivae are normal.  Neck: Neck supple. No JVD present.  No meningismal sign  Musculoskeletal: He exhibits tenderness (paracervical tenderness on exam, worsening with neck flexion/extension and lateral rotation.  NO significant midline spine tenderness, crepitus, or stepoff noted.  ).  Lymphadenopathy:    He has no cervical adenopathy.  Neurological: He is alert.  Skin: Skin is warm. No rash noted.  Psychiatric: He has a normal mood and affect.    ED Course  Procedures (including critical care time)  2:16 PM Neck pain radiates to bilateral  shoulder . Pt has tenderness along base of neck and trapezius muscle suggestive of musculoskeletal strain.  RICE therapy, muscle relaxant were discussed.  Work note given.  Ortho referral as needed.  Pt is afebrile, VSS.   Labs Reviewed - No data to display No results found.   1. Cervical strain, acute, initial encounter       MDM  BP 137/86  Pulse 78  Temp(Src) 98.4 F (36.9 C) (Oral)  Resp 20  SpO2 97%         Fayrene Helper, PA-C 12/11/12 1420

## 2013-02-20 ENCOUNTER — Encounter (HOSPITAL_COMMUNITY): Payer: Self-pay | Admitting: Emergency Medicine

## 2013-02-20 ENCOUNTER — Emergency Department (HOSPITAL_COMMUNITY)
Admission: EM | Admit: 2013-02-20 | Discharge: 2013-02-20 | Disposition: A | Discharge status: Home / Self Care | Payer: Self-pay | Attending: Emergency Medicine | Admitting: Emergency Medicine

## 2013-02-20 DIAGNOSIS — Z8679 Personal history of other diseases of the circulatory system: Secondary | ICD-10-CM | POA: Insufficient documentation

## 2013-02-20 DIAGNOSIS — Z87828 Personal history of other (healed) physical injury and trauma: Secondary | ICD-10-CM | POA: Insufficient documentation

## 2013-02-20 DIAGNOSIS — I10 Essential (primary) hypertension: Secondary | ICD-10-CM | POA: Insufficient documentation

## 2013-02-20 DIAGNOSIS — Z880 Allergy status to penicillin: Secondary | ICD-10-CM | POA: Insufficient documentation

## 2013-02-20 DIAGNOSIS — R112 Nausea with vomiting, unspecified: Secondary | ICD-10-CM | POA: Insufficient documentation

## 2013-02-20 DIAGNOSIS — Z87891 Personal history of nicotine dependence: Secondary | ICD-10-CM | POA: Insufficient documentation

## 2013-02-20 DIAGNOSIS — G43909 Migraine, unspecified, not intractable, without status migrainosus: Secondary | ICD-10-CM | POA: Insufficient documentation

## 2013-02-20 MED ORDER — SUMATRIPTAN SUCCINATE 6 MG/0.5ML ~~LOC~~ SOLN: SUBCUTANEOUS | Status: DC

## 2013-02-20 MED ORDER — SUMATRIPTAN SUCCINATE 6 MG/0.5ML ~~LOC~~ SOLN
6.0000 mg | Freq: Once | SUBCUTANEOUS | Status: AC
  Administered 2013-02-20: 6 mg via SUBCUTANEOUS
  Filled 2013-02-20: qty 0.5

## 2013-02-20 MED ORDER — ONDANSETRON 4 MG PO TBDP
8.0000 mg | ORAL_TABLET | Freq: Once | ORAL | Status: AC
  Administered 2013-02-20: 8 mg via ORAL
  Filled 2013-02-20: qty 2

## 2013-02-20 NOTE — ED Notes (Signed)
Dr. Steinl at bedside 

## 2013-02-20 NOTE — ED Notes (Signed)
Pt states he has been unable to eat due to nausea.
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
## 2013-02-20 NOTE — ED Notes (Addendum)
Pt c/o left sided headache since Thursday with vomiting. Pt has history of migraine and used to take imitrex, but due to his insurance he no longer gets it. Pt reports unable to tolerate PO food and fluids and blurred vision. Pt has not taken his BP meds due to insurance for months.

## 2013-02-20 NOTE — ED Notes (Signed)
Pt ambulated to restroom. 

## 2013-02-20 NOTE — ED Notes (Signed)
Pt c/o left sided headache that started on Thursday. Pt has hx of migraines. States he has been having n/v, light sensitivity. Pt reports he has tried multiple otc medications with no relief. Pt rates pain 8-9/10.

## 2013-05-26 ENCOUNTER — Emergency Department (HOSPITAL_COMMUNITY)
Admission: EM | Admit: 2013-05-26 | Discharge: 2013-05-26 | Disposition: A | Discharge status: Home / Self Care | Payer: Self-pay | Attending: Emergency Medicine | Admitting: Emergency Medicine

## 2013-05-26 ENCOUNTER — Encounter (HOSPITAL_COMMUNITY): Payer: Self-pay | Admitting: Emergency Medicine

## 2013-05-26 DIAGNOSIS — Y929 Unspecified place or not applicable: Secondary | ICD-10-CM | POA: Insufficient documentation

## 2013-05-26 DIAGNOSIS — Z880 Allergy status to penicillin: Secondary | ICD-10-CM | POA: Insufficient documentation

## 2013-05-26 DIAGNOSIS — S6980XA Other specified injuries of unspecified wrist, hand and finger(s), initial encounter: Secondary | ICD-10-CM | POA: Insufficient documentation

## 2013-05-26 DIAGNOSIS — S6990XA Unspecified injury of unspecified wrist, hand and finger(s), initial encounter: Secondary | ICD-10-CM | POA: Insufficient documentation

## 2013-05-26 DIAGNOSIS — Y939 Activity, unspecified: Secondary | ICD-10-CM | POA: Insufficient documentation

## 2013-05-26 DIAGNOSIS — I10 Essential (primary) hypertension: Secondary | ICD-10-CM | POA: Insufficient documentation

## 2013-05-26 DIAGNOSIS — S6991XA Unspecified injury of right wrist, hand and finger(s), initial encounter: Secondary | ICD-10-CM

## 2013-05-26 DIAGNOSIS — Z87891 Personal history of nicotine dependence: Secondary | ICD-10-CM | POA: Insufficient documentation

## 2013-05-26 DIAGNOSIS — W230XXA Caught, crushed, jammed, or pinched between moving objects, initial encounter: Secondary | ICD-10-CM | POA: Insufficient documentation

## 2013-05-26 NOTE — ED Notes (Signed)
Pt reports injuring right middle finger 6 weeks ago. States that he did not have it examined at that time. Reports nail has gotten loose and finger pad hurts when he taps it on an object. Came to the ER to have finger evaluated for possible infection. Denies pain at this time. No swelling noted. Discoloration noted on nailbed.

## 2013-05-26 NOTE — ED Notes (Signed)
Pt in stating he smashed his finger while at work about a month ago, states today while in the shower part of his nail broke off, pt in to make sure nothing is infected, no swelling noted
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
## 2013-05-30 NOTE — ED Provider Notes (Signed)
Medical screening examination/treatment/procedure(s) were performed by non-physician practitioner and as supervising physician I was immediately available for consultation/collaboration.  EKG Interpretation   None         Lyanne Co, MD 05/30/13 [phone]

## 2013-07-09 ENCOUNTER — Emergency Department (HOSPITAL_COMMUNITY)
Admission: EM | Admit: 2013-07-09 | Discharge: 2013-07-09 | Disposition: A | Discharge status: Home / Self Care | Payer: Self-pay | Attending: Emergency Medicine | Admitting: Emergency Medicine

## 2013-07-09 ENCOUNTER — Encounter (HOSPITAL_COMMUNITY): Payer: Self-pay | Admitting: Emergency Medicine

## 2013-07-09 ENCOUNTER — Emergency Department (HOSPITAL_COMMUNITY): Payer: Self-pay

## 2013-07-09 DIAGNOSIS — IMO0002 Reserved for concepts with insufficient information to code with codable children: Secondary | ICD-10-CM | POA: Insufficient documentation

## 2013-07-09 DIAGNOSIS — S6990XA Unspecified injury of unspecified wrist, hand and finger(s), initial encounter: Principal | ICD-10-CM | POA: Insufficient documentation

## 2013-07-09 DIAGNOSIS — Z87891 Personal history of nicotine dependence: Secondary | ICD-10-CM | POA: Insufficient documentation

## 2013-07-09 DIAGNOSIS — Z880 Allergy status to penicillin: Secondary | ICD-10-CM | POA: Insufficient documentation

## 2013-07-09 DIAGNOSIS — M25522 Pain in left elbow: Secondary | ICD-10-CM

## 2013-07-09 DIAGNOSIS — S59909A Unspecified injury of unspecified elbow, initial encounter: Secondary | ICD-10-CM | POA: Insufficient documentation

## 2013-07-09 DIAGNOSIS — I10 Essential (primary) hypertension: Secondary | ICD-10-CM | POA: Insufficient documentation

## 2013-07-09 DIAGNOSIS — S59919A Unspecified injury of unspecified forearm, initial encounter: Principal | ICD-10-CM

## 2013-07-09 DIAGNOSIS — G43909 Migraine, unspecified, not intractable, without status migrainosus: Secondary | ICD-10-CM | POA: Insufficient documentation

## 2013-07-09 DIAGNOSIS — Z79899 Other long term (current) drug therapy: Secondary | ICD-10-CM | POA: Insufficient documentation

## 2013-07-09 DIAGNOSIS — Y9289 Other specified places as the place of occurrence of the external cause: Secondary | ICD-10-CM | POA: Insufficient documentation

## 2013-07-09 DIAGNOSIS — Y9389 Activity, other specified: Secondary | ICD-10-CM | POA: Insufficient documentation

## 2013-07-09 DIAGNOSIS — Y990 Civilian activity done for income or pay: Secondary | ICD-10-CM | POA: Insufficient documentation

## 2013-07-09 MED ORDER — TRAMADOL HCL 50 MG PO TABS: 50.0000 mg | ORAL_TABLET | Freq: Four times a day (QID) | ORAL | Status: DC | PRN

## 2013-07-09 MED ORDER — IBUPROFEN 600 MG PO TABS: 600.0000 mg | ORAL_TABLET | Freq: Four times a day (QID) | ORAL | Status: DC | PRN

## 2013-07-09 NOTE — ED Notes (Signed)
Per pt sts he hit his elbow at work a few weeks ago and still hurting to the touch. Denies swelling.

## 2013-07-09 NOTE — ED Notes (Signed)
Discharge instructions reviewed, pt verbalized understanding.
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
## 2013-07-09 NOTE — ED Provider Notes (Signed)
Medical screening examination/treatment/procedure(s) were performed by non-physician practitioner and as supervising physician I was immediately available for consultation/collaboration.  EKG Interpretation   None         William Vito Beg, MD 07/09/13 1546 

## 2013-07-09 NOTE — ED Notes (Addendum)
Pt states he hit his left elbow a 3 weeks ago and the pain has not gotten better. Pt states he is not taking anything at home for pain. No visible redness or deformity noted. Pt able to move the arm with no problem. Pt states he googled that he could have a hairline fracture so he came to get an x-ray of elbow.

## 2013-07-09 NOTE — Discharge Instructions (Signed)
You may find a Tennis-elbow strap provides extra support and comfort.  This can be found at any local pharmacy.  You may also have pain relief by using ice 3-4x a day, especially after working or lifting.  [facility] weeks if pain not improving.

## 2013-10-21 ENCOUNTER — Emergency Department (HOSPITAL_COMMUNITY): Payer: Self-pay

## 2013-10-21 ENCOUNTER — Encounter (HOSPITAL_COMMUNITY): Payer: Self-pay | Admitting: Emergency Medicine

## 2013-10-21 ENCOUNTER — Emergency Department (HOSPITAL_COMMUNITY)
Admission: EM | Admit: 2013-10-21 | Discharge: 2013-10-21 | Disposition: A | Discharge status: Home / Self Care | Payer: Self-pay | Attending: Emergency Medicine | Admitting: Emergency Medicine

## 2013-10-21 DIAGNOSIS — G43909 Migraine, unspecified, not intractable, without status migrainosus: Secondary | ICD-10-CM | POA: Insufficient documentation

## 2013-10-21 DIAGNOSIS — Z880 Allergy status to penicillin: Secondary | ICD-10-CM | POA: Insufficient documentation

## 2013-10-21 DIAGNOSIS — R109 Unspecified abdominal pain: Secondary | ICD-10-CM

## 2013-10-21 DIAGNOSIS — Z79899 Other long term (current) drug therapy: Secondary | ICD-10-CM | POA: Insufficient documentation

## 2013-10-21 DIAGNOSIS — R519 Headache, unspecified: Secondary | ICD-10-CM

## 2013-10-21 DIAGNOSIS — Z87891 Personal history of nicotine dependence: Secondary | ICD-10-CM | POA: Insufficient documentation

## 2013-10-21 DIAGNOSIS — R1012 Left upper quadrant pain: Secondary | ICD-10-CM | POA: Insufficient documentation

## 2013-10-21 DIAGNOSIS — R51 Headache: Secondary | ICD-10-CM | POA: Insufficient documentation

## 2013-10-21 DIAGNOSIS — I10 Essential (primary) hypertension: Secondary | ICD-10-CM | POA: Insufficient documentation

## 2013-10-21 DIAGNOSIS — Z87828 Personal history of other (healed) physical injury and trauma: Secondary | ICD-10-CM | POA: Insufficient documentation

## 2013-10-21 DIAGNOSIS — R197 Diarrhea, unspecified: Secondary | ICD-10-CM | POA: Insufficient documentation

## 2013-10-21 DIAGNOSIS — R1013 Epigastric pain: Secondary | ICD-10-CM | POA: Insufficient documentation

## 2013-10-21 DIAGNOSIS — R1011 Right upper quadrant pain: Secondary | ICD-10-CM | POA: Insufficient documentation

## 2013-10-21 LAB — CBC WITH DIFFERENTIAL/PLATELET
BASOS ABS: 0 10*3/uL (ref 0.0–0.1)
Basophils Relative: 0 % (ref 0–1)
EOS PCT: 0 % (ref 0–5)
Eosinophils Absolute: 0 10*3/uL (ref 0.0–0.7)
HEMATOCRIT: 46.4 % (ref 39.0–52.0)
HEMOGLOBIN: 16.4 g/dL (ref 13.0–17.0)
LYMPHS PCT: 43 % (ref 12–46)
Lymphs Abs: 1.9 10*3/uL (ref 0.7–4.0)
MCH: 31.9 pg (ref 26.0–34.0)
MCHC: 35.3 g/dL (ref 30.0–36.0)
MCV: 90.3 fL (ref 78.0–100.0)
MONO ABS: 0.7 10*3/uL (ref 0.1–1.0)
MONOS PCT: 16 % — AB (ref 3–12)
NEUTROS ABS: 1.9 10*3/uL (ref 1.7–7.7)
Neutrophils Relative %: 41 % — ABNORMAL LOW (ref 43–77)
Platelets: 168 10*3/uL (ref 150–400)
RBC: 5.14 MIL/uL (ref 4.22–5.81)
RDW: 12.6 % (ref 11.5–15.5)
WBC: 4.5 10*3/uL (ref 4.0–10.5)

## 2013-10-21 LAB — COMPREHENSIVE METABOLIC PANEL
ALT: 27 U/L (ref 0–53)
AST: 31 U/L (ref 0–37)
Albumin: 4.1 g/dL (ref 3.5–5.2)
Alkaline Phosphatase: 64 U/L (ref 39–117)
BILIRUBIN TOTAL: 0.3 mg/dL (ref 0.3–1.2)
BUN: 11 mg/dL (ref 6–23)
CALCIUM: 9.8 mg/dL (ref 8.4–10.5)
CHLORIDE: 98 meq/L (ref 96–112)
CO2: 27 meq/L (ref 19–32)
CREATININE: 0.99 mg/dL (ref 0.50–1.35)
Glucose, Bld: 108 mg/dL — ABNORMAL HIGH (ref 70–99)
Potassium: 3.4 mEq/L — ABNORMAL LOW (ref 3.7–5.3)
Sodium: 139 mEq/L (ref 137–147)
Total Protein: 7.7 g/dL (ref 6.0–8.3)

## 2013-10-21 LAB — URINALYSIS, ROUTINE W REFLEX MICROSCOPIC
Bilirubin Urine: NEGATIVE
Glucose, UA: NEGATIVE mg/dL
Hgb urine dipstick: NEGATIVE
Ketones, ur: NEGATIVE mg/dL
LEUKOCYTES UA: NEGATIVE
NITRITE: NEGATIVE
PROTEIN: NEGATIVE mg/dL
SPECIFIC GRAVITY, URINE: 1.009 (ref 1.005–1.030)
UROBILINOGEN UA: 0.2 mg/dL (ref 0.0–1.0)
pH: 6 (ref 5.0–8.0)

## 2013-10-21 MED ORDER — METOCLOPRAMIDE HCL 5 MG/ML IJ SOLN
10.0000 mg | Freq: Once | INTRAMUSCULAR | Status: AC
  Administered 2013-10-21: 10 mg via INTRAVENOUS
  Filled 2013-10-21: qty 2

## 2013-10-21 MED ORDER — KETOROLAC TROMETHAMINE 30 MG/ML IJ SOLN
30.0000 mg | Freq: Once | INTRAMUSCULAR | Status: AC
  Administered 2013-10-21: 30 mg via INTRAVENOUS
  Filled 2013-10-21: qty 1

## 2013-10-21 MED ORDER — DEXAMETHASONE SODIUM PHOSPHATE 10 MG/ML IJ SOLN
10.0000 mg | Freq: Once | INTRAMUSCULAR | Status: AC
  Administered 2013-10-21: 10 mg via INTRAVENOUS
  Filled 2013-10-21: qty 1

## 2013-10-21 MED ORDER — DIPHENHYDRAMINE HCL 50 MG/ML IJ SOLN
25.0000 mg | Freq: Once | INTRAMUSCULAR | Status: AC
  Administered 2013-10-21: 25 mg via INTRAVENOUS
  Filled 2013-10-21: qty 1

## 2013-10-21 MED ORDER — SODIUM CHLORIDE 0.9 % IV BOLUS (SEPSIS)
1000.0000 mL | Freq: Once | INTRAVENOUS | Status: AC
  Administered 2013-10-21: 1000 mL via INTRAVENOUS

## 2013-10-21 MED ORDER — OXYCODONE-ACETAMINOPHEN 5-325 MG PO TABS
1.0000 | ORAL_TABLET | Freq: Once | ORAL | Status: DC
  Filled 2013-10-21: qty 1

## 2013-10-21 NOTE — ED Notes (Signed)
Pt stated that he is ready to leave. Informed pt that the EDP is reviewing CT and will be in to discuss results.

## 2013-10-21 NOTE — ED Notes (Addendum)
CT notified patient finished the contrast.

## 2013-10-21 NOTE — Discharge Planning (Signed)
[facility] Felicia E, [facility]iaison  Spoke to patient about primary care resources and establishing care with a provider. 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 Appointment made with [facility] Medicine @ [facility] for Monday May 11,2015 at 3:00 to obtain the orange card and establish primary care. Patient is aware of this appointment, application and my contact information was provided for any future questions or concerns. No other needs expressed at this time.
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
## 2013-10-21 NOTE — Discharge Instructions (Signed)
As discussed, your evaluation today has been largely reassuring.  But, it is important that you monitor your condition carefully, and do not hesitate to return to the ED if you develop new, or concerning changes in your condition. ? ?Otherwise, please follow-up with your physician for appropriate ongoing care. ? ?

## 2013-10-21 NOTE — ED Notes (Signed)
Ct called that patient completed Contrast.

## 2013-10-21 NOTE — ED Notes (Signed)
He states hes had a migraine all week. He took imitrex with no relief. He also has had diarrhea and R flank pain this week

## 2013-10-21 NOTE — ED Notes (Signed)
Pt up to the bathroom

## 2014-01-19 ENCOUNTER — Encounter (HOSPITAL_COMMUNITY): Payer: Self-pay | Admitting: Emergency Medicine

## 2014-01-19 ENCOUNTER — Emergency Department (HOSPITAL_COMMUNITY)
Admission: EM | Admit: 2014-01-19 | Discharge: 2014-01-19 | Disposition: A | Discharge status: Home / Self Care | Payer: Self-pay | Attending: Emergency Medicine | Admitting: Emergency Medicine

## 2014-01-19 ENCOUNTER — Emergency Department (HOSPITAL_COMMUNITY): Payer: Self-pay

## 2014-01-19 DIAGNOSIS — I10 Essential (primary) hypertension: Secondary | ICD-10-CM | POA: Insufficient documentation

## 2014-01-19 DIAGNOSIS — Z880 Allergy status to penicillin: Secondary | ICD-10-CM | POA: Insufficient documentation

## 2014-01-19 DIAGNOSIS — G43909 Migraine, unspecified, not intractable, without status migrainosus: Secondary | ICD-10-CM | POA: Insufficient documentation

## 2014-01-19 DIAGNOSIS — Z87891 Personal history of nicotine dependence: Secondary | ICD-10-CM | POA: Insufficient documentation

## 2014-01-19 DIAGNOSIS — Z87828 Personal history of other (healed) physical injury and trauma: Secondary | ICD-10-CM | POA: Insufficient documentation

## 2014-01-19 DIAGNOSIS — Z791 Long term (current) use of non-steroidal anti-inflammatories (NSAID): Secondary | ICD-10-CM | POA: Insufficient documentation

## 2014-01-19 DIAGNOSIS — K219 Gastro-esophageal reflux disease without esophagitis: Secondary | ICD-10-CM | POA: Insufficient documentation

## 2014-01-19 DIAGNOSIS — Z79899 Other long term (current) drug therapy: Secondary | ICD-10-CM | POA: Insufficient documentation

## 2014-01-19 LAB — COMPREHENSIVE METABOLIC PANEL
ALBUMIN: 3.8 g/dL (ref 3.5–5.2)
ALT: 17 U/L (ref 0–53)
AST: 22 U/L (ref 0–37)
Alkaline Phosphatase: 49 U/L (ref 39–117)
Anion gap: 13 (ref 5–15)
BUN: 14 mg/dL (ref 6–23)
CALCIUM: 9.1 mg/dL (ref 8.4–10.5)
CO2: 24 mEq/L (ref 19–32)
Chloride: 107 mEq/L (ref 96–112)
Creatinine, Ser: 0.91 mg/dL (ref 0.50–1.35)
GFR calc Af Amer: >90 mL/min (ref >90)
GFR calc non Af Amer: >90 mL/min (ref >90)
Glucose, Bld: 90 mg/dL (ref 70–99)
Potassium: 4.1 mEq/L (ref 3.7–5.3)
SODIUM: 144 meq/L (ref 137–147)
Total Bilirubin: 0.3 mg/dL (ref 0.3–1.2)
Total Protein: 6.9 g/dL (ref 6.0–8.3)

## 2014-01-19 LAB — CBC
HCT: 39.6 % (ref 39.0–52.0)
Hemoglobin: 13.5 g/dL (ref 13.0–17.0)
MCH: 31 pg (ref 26.0–34.0)
MCHC: 34.1 g/dL (ref 30.0–36.0)
MCV: 90.8 fL (ref 78.0–100.0)
PLATELETS: 200 10*3/uL (ref 150–400)
RBC: 4.36 MIL/uL (ref 4.22–5.81)
RDW: 12.9 % (ref 11.5–15.5)
WBC: 5.1 10*3/uL (ref 4.0–10.5)

## 2014-01-19 LAB — I-STAT TROPONIN, ED
TROPONIN I, POC: 0 ng/mL (ref 0.00–0.08)
TROPONIN I, POC: 0 ng/mL (ref 0.00–0.08)

## 2014-01-19 LAB — PROTIME-INR
INR: 0.97 (ref 0.00–1.49)
PROTHROMBIN TIME: 12.9 s (ref 11.6–15.2)

## 2014-01-19 MED ORDER — SODIUM CHLORIDE 0.9 % IV SOLN
1000.0000 mL | INTRAVENOUS | Status: DC
  Administered 2014-01-19: 1000 mL via INTRAVENOUS

## 2014-01-19 MED ORDER — OMEPRAZOLE 20 MG PO CPDR: 20.0000 mg | DELAYED_RELEASE_CAPSULE | Freq: Every day | ORAL | Status: DC

## 2014-01-19 MED ORDER — ASPIRIN 81 MG PO CHEW
324.0000 mg | CHEWABLE_TABLET | Freq: Once | ORAL | Status: AC
  Administered 2014-01-19: 324 mg via ORAL
  Filled 2014-01-19: qty 4

## 2014-01-19 NOTE — ED Notes (Signed)
Pt is getting dressed for discharge [place] discharge [place]

## 2014-01-19 NOTE — Discharge Planning (Signed)
[facility] [address] Community Liaison  Spoke to patient [facility] follow up care [facility] Liaison seen patient at last ED visit and [facility] pt [facility]. Pt states he was unable to follow up with the appointment due to a new job at the time. [facility] orange card application and resource guide provided to the patient. Patient was instructed to contact the practice once discharged to establish care. My contact information was also provided for any future questions or concerns. No other Community Liaison needs identified at this time.

## 2014-01-19 NOTE — ED Notes (Signed)
Pt placed back on monitor pt continues to be monitored by 5 lead, blood pressure, and pulse ox.

## 2014-01-19 NOTE — Discharge Instructions (Signed)
Gastroesophageal Reflux Disease, Adult Gastroesophageal reflux disease (GERD) happens when acid from your stomach flows up into the esophagus. When acid comes in contact with the esophagus, the acid causes soreness (inflammation) in the esophagus. Over time, GERD may create small holes (ulcers) in the lining of the esophagus. CAUSES   Increased body weight. This puts pressure on the stomach, making acid rise from the stomach into the esophagus.  Smoking. This increases acid production in the stomach.  Drinking alcohol. This causes decreased pressure in the lower esophageal sphincter (valve or ring of muscle between the esophagus and stomach), allowing acid from the stomach into the esophagus.  Late evening meals and a full stomach. This increases pressure and acid production in the stomach.  A malformed lower esophageal sphincter. Sometimes, no cause is found. SYMPTOMS   Burning pain in the lower part of the mid-chest behind the breastbone and in the mid-stomach area. This may occur twice a week or more often.  Trouble swallowing.  Sore throat.  Dry cough.  Asthma-like symptoms including chest tightness, shortness of breath, or wheezing. DIAGNOSIS  Your caregiver may be able to diagnose GERD based on your symptoms. In some cases, X-rays and other tests may be done to check for complications or to check the condition of your stomach and esophagus. TREATMENT  Your caregiver may recommend over-the-counter or prescription medicines to help decrease acid production. Ask your caregiver before starting or adding any new medicines.  HOME CARE INSTRUCTIONS   Change the factors that you can control. Ask your caregiver for guidance concerning weight loss, quitting smoking, and alcohol consumption.  Avoid foods and drinks that make your symptoms worse, such as:  Caffeine or alcoholic drinks.  Chocolate.  Peppermint or mint flavorings.  Garlic and onions.  Spicy foods.  Citrus fruits,  such as oranges, lemons, or limes.  Tomato-based foods such as sauce, chili, salsa, and pizza.  Fried and fatty foods.  Avoid lying down for the 3 hours prior to your bedtime or prior to taking a nap.  Eat small, frequent meals instead of large meals.  Wear loose-fitting clothing. Do not wear anything tight around your waist that causes pressure on your stomach.  Raise the head of your bed 6 to 8 inches with wood blocks to help you sleep. Extra pillows will not help.  Only take over-the-counter or prescription medicines for pain, discomfort, or fever as directed by your caregiver.  Do not take aspirin, ibuprofen, or other nonsteroidal anti-inflammatory drugs (NSAIDs). SEEK IMMEDIATE MEDICAL CARE IF:   You have pain in your arms, neck, jaw, teeth, or back.  Your pain increases or changes in intensity or duration.  You develop nausea, vomiting, or sweating (diaphoresis).  You develop shortness of breath, or you faint.  Your vomit is green, yellow, black, or looks like coffee grounds or blood.  Your stool is red, bloody, or black. These symptoms could be signs of other problems, such as heart disease, gastric bleeding, or esophageal bleeding. MAKE SURE YOU:   Understand these instructions.  Will watch your condition.  Will get help right away if you are not doing well or get worse. Document Released: 04/02/2005 Document Revised: 09/15/2011 Document Reviewed: 01/10/2011 Exit[employer] Patient Information 2015 [employer]. This information is not intended to replace advice given to you by your health care provider. Make sure you discuss any questions you have with your health care provider.  

## 2014-01-19 NOTE — ED Notes (Signed)
Pt stated he woke up feeling SOB at 0300.  States he has pain in his chest that feels tight and dull.
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
## 2014-04-29 ENCOUNTER — Emergency Department (HOSPITAL_COMMUNITY)
Admission: EM | Admit: 2014-04-29 | Discharge: 2014-04-29 | Disposition: A | Discharge status: Home / Self Care | Payer: Self-pay | Attending: Emergency Medicine | Admitting: Emergency Medicine

## 2014-04-29 ENCOUNTER — Encounter (HOSPITAL_COMMUNITY): Payer: Self-pay | Admitting: Emergency Medicine

## 2014-04-29 DIAGNOSIS — R519 Headache, unspecified: Secondary | ICD-10-CM

## 2014-04-29 DIAGNOSIS — Z79899 Other long term (current) drug therapy: Secondary | ICD-10-CM | POA: Insufficient documentation

## 2014-04-29 DIAGNOSIS — I10 Essential (primary) hypertension: Secondary | ICD-10-CM | POA: Insufficient documentation

## 2014-04-29 DIAGNOSIS — Z880 Allergy status to penicillin: Secondary | ICD-10-CM | POA: Insufficient documentation

## 2014-04-29 DIAGNOSIS — Z87828 Personal history of other (healed) physical injury and trauma: Secondary | ICD-10-CM | POA: Insufficient documentation

## 2014-04-29 DIAGNOSIS — R51 Headache: Secondary | ICD-10-CM

## 2014-04-29 DIAGNOSIS — G43909 Migraine, unspecified, not intractable, without status migrainosus: Secondary | ICD-10-CM | POA: Insufficient documentation

## 2014-04-29 DIAGNOSIS — Z87891 Personal history of nicotine dependence: Secondary | ICD-10-CM | POA: Insufficient documentation

## 2014-04-29 MED ORDER — KETOROLAC TROMETHAMINE 30 MG/ML IJ SOLN
30.0000 mg | Freq: Once | INTRAMUSCULAR | Status: AC
  Administered 2014-04-29: 30 mg via INTRAVENOUS
  Filled 2014-04-29: qty 1

## 2014-04-29 MED ORDER — SODIUM CHLORIDE 0.9 % IV BOLUS (SEPSIS)
1000.0000 mL | Freq: Once | INTRAVENOUS | Status: AC
  Administered 2014-04-29: 1000 mL via INTRAVENOUS

## 2014-04-29 MED ORDER — DIPHENHYDRAMINE HCL 50 MG/ML IJ SOLN
25.0000 mg | Freq: Once | INTRAMUSCULAR | Status: AC
  Administered 2014-04-29: 25 mg via INTRAVENOUS
  Filled 2014-04-29: qty 1

## 2014-04-29 MED ORDER — PROCHLORPERAZINE EDISYLATE 5 MG/ML IJ SOLN
10.0000 mg | Freq: Once | INTRAMUSCULAR | Status: AC
  Administered 2014-04-29: 10 mg via INTRAVENOUS
  Filled 2014-04-29: qty 2

## 2014-04-29 NOTE — ED Notes (Signed)
Patient reports he has a headache in the frontal section, that started since Friday.  He has a history of migraines, did not take any medications today for pain.  Alert and oriented at this time.

## 2014-04-29 NOTE — Discharge Instructions (Signed)
Please follow the directions provided. Be sure to followup with your primary care provider to ensure you're getting better and to discuss headache management. Don't hesitate to return for new worsening or concerning symptoms.  SEEK IMMEDIATE MEDICAL CARE IF:  Your headache becomes severe.  You have a fever.  You have a stiff neck.  You have loss of vision.  You have muscular weakness or loss of muscle control.  You start losing your balance or have trouble walking.  You feel faint or pass out.  You have severe symptoms that are different from your first symptoms.

## 2014-04-29 NOTE — ED Notes (Signed)
Patient ambulated to restroom without problems.
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
## 2014-04-30 NOTE — ED Provider Notes (Signed)
Medical screening examination/treatment/procedure(s) were performed by non-physician practitioner and as supervising physician I was immediately available for consultation/collaboration.   EKG Interpretation None        Enid SkeensJoshua M Trista Ciocca, MD 04/30/14 [phone]

## 2014-05-18 ENCOUNTER — Emergency Department (HOSPITAL_COMMUNITY)
Admission: EM | Admit: 2014-05-18 | Discharge: 2014-05-18 | Disposition: A | Discharge status: Home / Self Care | Payer: Self-pay | Attending: Emergency Medicine | Admitting: Emergency Medicine

## 2014-05-18 ENCOUNTER — Encounter (HOSPITAL_COMMUNITY): Payer: Self-pay

## 2014-05-18 DIAGNOSIS — I10 Essential (primary) hypertension: Secondary | ICD-10-CM | POA: Insufficient documentation

## 2014-05-18 DIAGNOSIS — S3992XA Unspecified injury of lower back, initial encounter: Secondary | ICD-10-CM | POA: Insufficient documentation

## 2014-05-18 DIAGNOSIS — M545 Low back pain, unspecified: Secondary | ICD-10-CM

## 2014-05-18 DIAGNOSIS — Z79899 Other long term (current) drug therapy: Secondary | ICD-10-CM | POA: Insufficient documentation

## 2014-05-18 DIAGNOSIS — Y9289 Other specified places as the place of occurrence of the external cause: Secondary | ICD-10-CM | POA: Insufficient documentation

## 2014-05-18 DIAGNOSIS — Z87891 Personal history of nicotine dependence: Secondary | ICD-10-CM | POA: Insufficient documentation

## 2014-05-18 DIAGNOSIS — Z87828 Personal history of other (healed) physical injury and trauma: Secondary | ICD-10-CM | POA: Insufficient documentation

## 2014-05-18 DIAGNOSIS — W2209XA Striking against other stationary object, initial encounter: Secondary | ICD-10-CM | POA: Insufficient documentation

## 2014-05-18 DIAGNOSIS — Z8679 Personal history of other diseases of the circulatory system: Secondary | ICD-10-CM | POA: Insufficient documentation

## 2014-05-18 DIAGNOSIS — Y9389 Activity, other specified: Secondary | ICD-10-CM | POA: Insufficient documentation

## 2014-05-18 DIAGNOSIS — Y990 Civilian activity done for income or pay: Secondary | ICD-10-CM | POA: Insufficient documentation

## 2014-05-18 DIAGNOSIS — Z880 Allergy status to penicillin: Secondary | ICD-10-CM | POA: Insufficient documentation

## 2014-05-18 MED ORDER — MELOXICAM 7.5 MG PO TABS: 7.5000 mg | ORAL_TABLET | Freq: Every day | ORAL | Status: DC

## 2014-05-18 MED ORDER — TRAMADOL HCL 50 MG PO TABS: 50.0000 mg | ORAL_TABLET | Freq: Four times a day (QID) | ORAL | Status: DC | PRN

## 2014-05-18 MED ORDER — CYCLOBENZAPRINE HCL 10 MG PO TABS: 10.0000 mg | ORAL_TABLET | Freq: Two times a day (BID) | ORAL | Status: DC | PRN

## 2014-05-18 NOTE — ED Notes (Signed)
Pt comfortable with discharge and follow up instructions. Pt declines wheelchair, escorted to waiting area by this RN. Prescriptions x3. 

## 2014-05-18 NOTE — ED Notes (Signed)
Pt sts he does heavy lifting at work.  He also sts bumping "hard" into a metal pole when trying to wrap a pallet at work in a tight space.  No bruising noted to lower back.  Pain worsens as day progresses.

## 2014-05-18 NOTE — ED Notes (Signed)
Pt states he hurt his lower back at work yesterday. Has been bothering him since then. Has also done some heavy lifting.
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
## 2014-05-23 NOTE — Progress Notes (Signed)
[facility] was not able to see the patient, [facility] orange card information will be sent to the address provided. °

## 2014-08-16 ENCOUNTER — Emergency Department (HOSPITAL_COMMUNITY)
Admission: EM | Admit: 2014-08-16 | Discharge: 2014-08-16 | Disposition: A | Discharge status: Home / Self Care | Payer: Self-pay | Attending: Emergency Medicine | Admitting: Emergency Medicine

## 2014-08-16 DIAGNOSIS — Z87891 Personal history of nicotine dependence: Secondary | ICD-10-CM | POA: Insufficient documentation

## 2014-08-16 DIAGNOSIS — I10 Essential (primary) hypertension: Secondary | ICD-10-CM | POA: Insufficient documentation

## 2014-08-16 DIAGNOSIS — G43809 Other migraine, not intractable, without status migrainosus: Secondary | ICD-10-CM | POA: Insufficient documentation

## 2014-08-16 DIAGNOSIS — Z87828 Personal history of other (healed) physical injury and trauma: Secondary | ICD-10-CM | POA: Insufficient documentation

## 2014-08-16 DIAGNOSIS — Z79899 Other long term (current) drug therapy: Secondary | ICD-10-CM | POA: Insufficient documentation

## 2014-08-16 DIAGNOSIS — Z880 Allergy status to penicillin: Secondary | ICD-10-CM | POA: Insufficient documentation

## 2014-08-16 DIAGNOSIS — Z791 Long term (current) use of non-steroidal anti-inflammatories (NSAID): Secondary | ICD-10-CM | POA: Insufficient documentation

## 2014-08-16 NOTE — ED Notes (Signed)
Epic computer system back up. All previous charting on paper.

## 2014-08-16 NOTE — Discharge Instructions (Signed)
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
## 2014-09-16 ENCOUNTER — Emergency Department (HOSPITAL_COMMUNITY)
Admission: EM | Admit: 2014-09-16 | Discharge: 2014-09-16 | Disposition: A | Discharge status: Home / Self Care | Payer: Self-pay | Attending: Emergency Medicine | Admitting: Emergency Medicine

## 2014-09-16 ENCOUNTER — Encounter (HOSPITAL_COMMUNITY): Payer: Self-pay | Admitting: Physical Medicine and Rehabilitation

## 2014-09-16 DIAGNOSIS — Z87891 Personal history of nicotine dependence: Secondary | ICD-10-CM | POA: Insufficient documentation

## 2014-09-16 DIAGNOSIS — Z791 Long term (current) use of non-steroidal anti-inflammatories (NSAID): Secondary | ICD-10-CM | POA: Insufficient documentation

## 2014-09-16 DIAGNOSIS — Z87828 Personal history of other (healed) physical injury and trauma: Secondary | ICD-10-CM | POA: Insufficient documentation

## 2014-09-16 DIAGNOSIS — Z79899 Other long term (current) drug therapy: Secondary | ICD-10-CM | POA: Insufficient documentation

## 2014-09-16 DIAGNOSIS — R519 Headache, unspecified: Secondary | ICD-10-CM

## 2014-09-16 DIAGNOSIS — Z880 Allergy status to penicillin: Secondary | ICD-10-CM | POA: Insufficient documentation

## 2014-09-16 DIAGNOSIS — G43909 Migraine, unspecified, not intractable, without status migrainosus: Secondary | ICD-10-CM | POA: Insufficient documentation

## 2014-09-16 DIAGNOSIS — R51 Headache: Secondary | ICD-10-CM

## 2014-09-16 DIAGNOSIS — I10 Essential (primary) hypertension: Secondary | ICD-10-CM | POA: Insufficient documentation

## 2014-09-16 MED ORDER — SODIUM CHLORIDE 0.9 % IV BOLUS (SEPSIS): 1000.0000 mL | Freq: Once | INTRAVENOUS | Status: DC

## 2014-09-16 MED ORDER — KETOROLAC TROMETHAMINE 30 MG/ML IJ SOLN
30.0000 mg | Freq: Once | INTRAMUSCULAR | Status: AC
  Administered 2014-09-16: 30 mg via INTRAVENOUS
  Filled 2014-09-16: qty 1

## 2014-09-16 MED ORDER — METOCLOPRAMIDE HCL 5 MG/ML IJ SOLN
10.0000 mg | Freq: Once | INTRAMUSCULAR | Status: AC
  Administered 2014-09-16: 10 mg via INTRAVENOUS
  Filled 2014-09-16: qty 2

## 2014-09-16 NOTE — ED Notes (Signed)
Pt presents to department for evaluation of headache. Ongoing x2 days. Also reports photosensitivity, nausea and blurred vision. 9/10 pain upon arrival. Pt is alert and oriented x4.
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
## 2014-09-16 NOTE — Discharge Instructions (Signed)

## 2014-10-11 ENCOUNTER — Emergency Department (HOSPITAL_COMMUNITY)
Admission: EM | Admit: 2014-10-11 | Discharge: 2014-10-11 | Disposition: A | Discharge status: Home / Self Care | Payer: Self-pay | Attending: Emergency Medicine | Admitting: Emergency Medicine

## 2014-10-11 ENCOUNTER — Encounter (HOSPITAL_COMMUNITY): Payer: Self-pay

## 2014-10-11 DIAGNOSIS — I10 Essential (primary) hypertension: Secondary | ICD-10-CM | POA: Insufficient documentation

## 2014-10-11 DIAGNOSIS — G43009 Migraine without aura, not intractable, without status migrainosus: Secondary | ICD-10-CM | POA: Insufficient documentation

## 2014-10-11 DIAGNOSIS — G43909 Migraine, unspecified, not intractable, without status migrainosus: Secondary | ICD-10-CM

## 2014-10-11 DIAGNOSIS — Z79899 Other long term (current) drug therapy: Secondary | ICD-10-CM | POA: Insufficient documentation

## 2014-10-11 DIAGNOSIS — Z87828 Personal history of other (healed) physical injury and trauma: Secondary | ICD-10-CM | POA: Insufficient documentation

## 2014-10-11 DIAGNOSIS — Z87891 Personal history of nicotine dependence: Secondary | ICD-10-CM | POA: Insufficient documentation

## 2014-10-11 DIAGNOSIS — Z880 Allergy status to penicillin: Secondary | ICD-10-CM | POA: Insufficient documentation

## 2014-10-11 MED ORDER — KETOROLAC TROMETHAMINE 30 MG/ML IJ SOLN
30.0000 mg | Freq: Once | INTRAMUSCULAR | Status: AC
  Administered 2014-10-11: 30 mg via INTRAVENOUS
  Filled 2014-10-11: qty 1

## 2014-10-11 MED ORDER — SODIUM CHLORIDE 0.9 % IV BOLUS (SEPSIS)
1000.0000 mL | Freq: Once | INTRAVENOUS | Status: AC
  Administered 2014-10-11: 1000 mL via INTRAVENOUS

## 2014-10-11 MED ORDER — DIPHENHYDRAMINE HCL 50 MG/ML IJ SOLN
25.0000 mg | Freq: Once | INTRAMUSCULAR | Status: AC
  Administered 2014-10-11: 25 mg via INTRAVENOUS
  Filled 2014-10-11: qty 1

## 2014-10-11 MED ORDER — METOCLOPRAMIDE HCL 5 MG/ML IJ SOLN
10.0000 mg | Freq: Once | INTRAMUSCULAR | Status: AC
  Administered 2014-10-11: 10 mg via INTRAVENOUS
  Filled 2014-10-11: qty 2
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
## 2014-10-11 NOTE — ED Notes (Signed)
Pt states that he has had a headache since Saturday, he went to [facility] on Monday and received the migraine cocktail but the headache came back, pt states he has high bp but is taking his meds properly.

## 2015-04-11 ENCOUNTER — Emergency Department (HOSPITAL_COMMUNITY)
Admission: EM | Admit: 2015-04-11 | Discharge: 2015-04-12 | Discharge status: Against Medical Advice | Payer: Self-pay | Attending: Emergency Medicine | Admitting: Emergency Medicine

## 2015-04-11 ENCOUNTER — Emergency Department (HOSPITAL_COMMUNITY)
Admission: EM | Admit: 2015-04-11 | Discharge: 2015-04-11 | Discharge status: Against Medical Advice | Payer: Self-pay | Attending: Emergency Medicine | Admitting: Emergency Medicine

## 2015-04-11 ENCOUNTER — Encounter (HOSPITAL_COMMUNITY): Payer: Self-pay | Admitting: *Deleted

## 2015-04-11 ENCOUNTER — Encounter (HOSPITAL_COMMUNITY): Payer: Self-pay | Admitting: Neurology

## 2015-04-11 DIAGNOSIS — R51 Headache: Secondary | ICD-10-CM | POA: Insufficient documentation

## 2015-04-11 DIAGNOSIS — I10 Essential (primary) hypertension: Secondary | ICD-10-CM | POA: Insufficient documentation

## 2015-04-11 DIAGNOSIS — G43909 Migraine, unspecified, not intractable, without status migrainosus: Secondary | ICD-10-CM | POA: Insufficient documentation

## 2015-04-11 NOTE — ED Notes (Addendum)
Pt reports migraine x2 days,  pain on right side behind his eye and back of head, pain 9/10.  Denies fall or injury. sensitivity to light, took Imitrex on Monday with no relief. Has not taken any medications today other than BP med. Pt was at [facility] but left after waiting 2 hours.   rn informed pt that he would have to wait in lobby until room is available. Pt reports he was at [facility] called over here and was told there was a 20 minute wait, rn informed pt that a lot of patients have arrived recently. Pt said he just wanted to go home, but then pt said he would wait a few minutes.

## 2015-04-11 NOTE — ED Notes (Signed)
Pt approached nurse first and stated he wanted to leave. Pt encouraged to stay but still chose to leave.

## 2015-04-11 NOTE — ED Notes (Signed)
Pt reports migraine h/a for 2 days, feels pain on right side behind his eye and back of head. Denies fall or injury. Pt is a x 4. Photophobia. Takes Imitrex for pain with no relief.

## 2015-05-02 ENCOUNTER — Encounter (HOSPITAL_COMMUNITY): Payer: Self-pay | Admitting: Emergency Medicine

## 2015-05-02 ENCOUNTER — Emergency Department (HOSPITAL_COMMUNITY)
Admission: EM | Admit: 2015-05-02 | Discharge: 2015-05-02 | Disposition: A | Discharge status: Home / Self Care | Payer: Self-pay | Attending: Emergency Medicine | Admitting: Emergency Medicine

## 2015-05-02 DIAGNOSIS — R519 Headache, unspecified: Secondary | ICD-10-CM

## 2015-05-02 DIAGNOSIS — Z7982 Long term (current) use of aspirin: Secondary | ICD-10-CM | POA: Insufficient documentation

## 2015-05-02 DIAGNOSIS — I10 Essential (primary) hypertension: Secondary | ICD-10-CM | POA: Insufficient documentation

## 2015-05-02 DIAGNOSIS — Z87891 Personal history of nicotine dependence: Secondary | ICD-10-CM | POA: Insufficient documentation

## 2015-05-02 DIAGNOSIS — Z880 Allergy status to penicillin: Secondary | ICD-10-CM | POA: Insufficient documentation

## 2015-05-02 DIAGNOSIS — R51 Headache: Secondary | ICD-10-CM

## 2015-05-02 DIAGNOSIS — Z87828 Personal history of other (healed) physical injury and trauma: Secondary | ICD-10-CM | POA: Insufficient documentation

## 2015-05-02 DIAGNOSIS — G43909 Migraine, unspecified, not intractable, without status migrainosus: Secondary | ICD-10-CM | POA: Insufficient documentation

## 2015-05-02 MED ORDER — SODIUM CHLORIDE 0.9 % IV BOLUS (SEPSIS)
1000.0000 mL | Freq: Once | INTRAVENOUS | Status: AC
  Administered 2015-05-02: 1000 mL via INTRAVENOUS

## 2015-05-02 MED ORDER — METOCLOPRAMIDE HCL 5 MG/ML IJ SOLN
10.0000 mg | Freq: Once | INTRAMUSCULAR | Status: AC
  Administered 2015-05-02: 10 mg via INTRAVENOUS
  Filled 2015-05-02: qty 2

## 2015-05-02 MED ORDER — DEXAMETHASONE SODIUM PHOSPHATE 10 MG/ML IJ SOLN
10.0000 mg | Freq: Once | INTRAMUSCULAR | Status: AC
  Administered 2015-05-02: 10 mg via INTRAVENOUS
  Filled 2015-05-02: qty 1

## 2015-05-02 MED ORDER — DIPHENHYDRAMINE HCL 50 MG/ML IJ SOLN
25.0000 mg | Freq: Once | INTRAMUSCULAR | Status: AC
  Administered 2015-05-02: 25 mg via INTRAVENOUS
  Filled 2015-05-02: qty 1

## 2015-05-02 MED ORDER — KETOROLAC TROMETHAMINE 30 MG/ML IJ SOLN
30.0000 mg | Freq: Once | INTRAMUSCULAR | Status: AC
  Administered 2015-05-02: 30 mg via INTRAVENOUS
  Filled 2015-05-02: qty 1

## 2015-05-02 NOTE — ED Notes (Signed)
Pt ambulatory from triage to A13 with breakfast in hand; pt eating breakfast while being questioned/triaged; NAD

## 2015-05-02 NOTE — ED Notes (Signed)
Pt presents to ER from home c/o headache with photophobia, n/v; pt sts the headache started a week ago and intermittently getting worse; OTC "ibuprofen and BC powders help for a little while but comes back"; pt hx of HTN (takes lisinopril); hypertensive in triage
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
## 2015-05-02 NOTE — Discharge Instructions (Signed)

## 2015-08-02 ENCOUNTER — Encounter (HOSPITAL_COMMUNITY): Payer: Self-pay | Admitting: Emergency Medicine

## 2015-08-02 ENCOUNTER — Emergency Department (HOSPITAL_COMMUNITY)
Admission: EM | Admit: 2015-08-02 | Discharge: 2015-08-02 | Disposition: A | Discharge status: Home / Self Care | Payer: Self-pay | Attending: Emergency Medicine | Admitting: Emergency Medicine

## 2015-08-02 DIAGNOSIS — Z87891 Personal history of nicotine dependence: Secondary | ICD-10-CM | POA: Insufficient documentation

## 2015-08-02 DIAGNOSIS — Z79899 Other long term (current) drug therapy: Secondary | ICD-10-CM | POA: Insufficient documentation

## 2015-08-02 DIAGNOSIS — Z87828 Personal history of other (healed) physical injury and trauma: Secondary | ICD-10-CM | POA: Insufficient documentation

## 2015-08-02 DIAGNOSIS — M542 Cervicalgia: Secondary | ICD-10-CM | POA: Insufficient documentation

## 2015-08-02 DIAGNOSIS — I10 Essential (primary) hypertension: Secondary | ICD-10-CM | POA: Insufficient documentation

## 2015-08-02 DIAGNOSIS — G43909 Migraine, unspecified, not intractable, without status migrainosus: Secondary | ICD-10-CM | POA: Insufficient documentation

## 2015-08-02 DIAGNOSIS — Z880 Allergy status to penicillin: Secondary | ICD-10-CM | POA: Insufficient documentation

## 2015-08-02 MED ORDER — SODIUM CHLORIDE 0.9 % IV BOLUS (SEPSIS)
1000.0000 mL | Freq: Once | INTRAVENOUS | Status: AC
Start: 2015-08-02 — End: 2015-08-02
  Administered 2015-08-02: 1000 mL via INTRAVENOUS

## 2015-08-02 MED ORDER — DIPHENHYDRAMINE HCL 50 MG/ML IJ SOLN
25.0000 mg | Freq: Once | INTRAMUSCULAR | Status: AC
Start: 2015-08-02 — End: 2015-08-02
  Administered 2015-08-02: 25 mg via INTRAVENOUS
  Filled 2015-08-02: qty 1

## 2015-08-02 MED ORDER — METOCLOPRAMIDE HCL 5 MG/ML IJ SOLN
10.0000 mg | Freq: Once | INTRAMUSCULAR | Status: AC
  Administered 2015-08-02: 10 mg via INTRAVENOUS
  Filled 2015-08-02: qty 2

## 2015-08-02 MED ORDER — KETOROLAC TROMETHAMINE 30 MG/ML IJ SOLN
30.0000 mg | Freq: Once | INTRAMUSCULAR | Status: AC
  Administered 2015-08-02: 30 mg via INTRAVENOUS
  Filled 2015-08-02: qty 1

## 2015-08-02 NOTE — ED Notes (Signed)
Blood samples and urine walked to [facility].

## 2015-08-02 NOTE — ED Notes (Signed)
Patient reports having migraine headache since getting off work last night around 7pm that never went away. Reports mild nausea and sensativity to light. Hx of migraines. Rates headache 8/10.
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
## 2015-08-02 NOTE — Discharge Instructions (Signed)

## 2015-09-19 ENCOUNTER — Emergency Department (HOSPITAL_COMMUNITY)
Admission: EM | Admit: 2015-09-19 | Discharge: 2015-09-20 | Disposition: A | Discharge status: Home / Self Care | Payer: BLUE CROSS/BLUE SHIELD | Attending: Emergency Medicine | Admitting: Emergency Medicine

## 2015-09-19 ENCOUNTER — Encounter (HOSPITAL_COMMUNITY): Payer: Self-pay | Admitting: Adult Health

## 2015-09-19 DIAGNOSIS — R51 Headache: Secondary | ICD-10-CM | POA: Insufficient documentation

## 2015-09-19 DIAGNOSIS — I10 Essential (primary) hypertension: Secondary | ICD-10-CM | POA: Diagnosis not present

## 2015-09-19 NOTE — ED Notes (Addendum)
Presents with headache began Saturday, treated at Dr yesterday and given a shot, today the headache came back. Endorses light sensitivity. HX of headaches. Alert, oriented, MAE x4. HE states the migraine cocktails always help him. Especially toradol.
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
## 2015-10-29 ENCOUNTER — Encounter (HOSPITAL_COMMUNITY): Payer: Self-pay | Admitting: Emergency Medicine

## 2015-10-29 ENCOUNTER — Emergency Department (HOSPITAL_COMMUNITY)
Admission: EM | Admit: 2015-10-29 | Discharge: 2015-10-30 | Disposition: A | Discharge status: Home / Self Care | Payer: BLUE CROSS/BLUE SHIELD | Attending: Emergency Medicine | Admitting: Emergency Medicine

## 2015-10-29 DIAGNOSIS — S199XXA Unspecified injury of neck, initial encounter: Secondary | ICD-10-CM | POA: Diagnosis not present

## 2015-10-29 DIAGNOSIS — G43909 Migraine, unspecified, not intractable, without status migrainosus: Secondary | ICD-10-CM | POA: Diagnosis not present

## 2015-10-29 DIAGNOSIS — Z880 Allergy status to penicillin: Secondary | ICD-10-CM | POA: Insufficient documentation

## 2015-10-29 DIAGNOSIS — S8001XA Contusion of right knee, initial encounter: Secondary | ICD-10-CM | POA: Diagnosis not present

## 2015-10-29 DIAGNOSIS — Z87891 Personal history of nicotine dependence: Secondary | ICD-10-CM | POA: Insufficient documentation

## 2015-10-29 DIAGNOSIS — W010XXA Fall on same level from slipping, tripping and stumbling without subsequent striking against object, initial encounter: Secondary | ICD-10-CM | POA: Insufficient documentation

## 2015-10-29 DIAGNOSIS — Y998 Other external cause status: Secondary | ICD-10-CM | POA: Diagnosis not present

## 2015-10-29 DIAGNOSIS — Y92481 Parking lot as the place of occurrence of the external cause: Secondary | ICD-10-CM | POA: Diagnosis not present

## 2015-10-29 DIAGNOSIS — Z79899 Other long term (current) drug therapy: Secondary | ICD-10-CM | POA: Insufficient documentation

## 2015-10-29 DIAGNOSIS — I10 Essential (primary) hypertension: Secondary | ICD-10-CM | POA: Insufficient documentation

## 2015-10-29 DIAGNOSIS — S29001A Unspecified injury of muscle and tendon of front wall of thorax, initial encounter: Secondary | ICD-10-CM | POA: Diagnosis present

## 2015-10-29 DIAGNOSIS — Y9301 Activity, walking, marching and hiking: Secondary | ICD-10-CM | POA: Diagnosis not present

## 2015-10-29 DIAGNOSIS — S43402A Unspecified sprain of left shoulder joint, initial encounter: Secondary | ICD-10-CM | POA: Insufficient documentation

## 2015-10-29 DIAGNOSIS — S20212A Contusion of left front wall of thorax, initial encounter: Secondary | ICD-10-CM | POA: Insufficient documentation
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
## 2015-10-29 NOTE — ED Notes (Signed)
PA at bedside.

## 2015-10-30 MED ORDER — METHOCARBAMOL 500 MG PO TABS: 1000.0000 mg | ORAL_TABLET | Freq: Four times a day (QID) | ORAL | Status: AC

## 2015-10-30 MED ORDER — NAPROXEN 500 MG PO TABS: 500.0000 mg | ORAL_TABLET | Freq: Two times a day (BID) | ORAL | Status: AC

## 2015-10-30 NOTE — Discharge Instructions (Signed)
Please read and follow all provided instructions.  Your diagnoses today include:  1. Rib contusion, left, initial encounter   2. Shoulder sprain, left, initial encounter   3. Knee contusion, right, initial encounter     Tests performed today include:  Vital signs. See below for your results today.   Medications prescribed:   Robaxin (methocarbamol) - muscle relaxer medication  DO NOT drive or perform any activities that require you to be awake and alert because this medicine can make you drowsy.    Naproxen - anti-inflammatory pain medication  Do not exceed 500mg  naproxen every 12 hours, take with food  You have been prescribed an anti-inflammatory medication or NSAID. Take with food. Take smallest effective dose for the shortest duration needed for your pain. Stop taking if you experience stomach pain or vomiting.   Take any prescribed medications only as directed.  Home care instructions:  Follow any educational materials contained in this packet.  BE VERY CAREFUL not to take multiple medicines containing Tylenol (also called acetaminophen). Doing so can lead to an overdose which can damage your liver and cause liver failure and possibly death.   Follow-up instructions: Please follow-up with your primary care provider in the next 3 days for further evaluation of your symptoms.   Return instructions:   Please return to the Emergency Department if you experience worsening symptoms.   Please return if you have any other emergent concerns.  Additional Information:  Your vital signs today were: BP 152/104 mmHg   Pulse 59   Temp(Src) 97.7 F (36.5 C) (Oral)   Resp 18   SpO2 99% If your blood pressure (BP) was elevated above 135/85 this visit, please have this repeated by your doctor within one month. --------------

## 2016-07-03 ENCOUNTER — Encounter (HOSPITAL_COMMUNITY): Payer: Self-pay

## 2016-07-03 ENCOUNTER — Emergency Department (HOSPITAL_COMMUNITY)
Admission: EM | Admit: 2016-07-03 | Discharge: 2016-07-03 | Disposition: A | Discharge status: Home / Self Care | Payer: BLUE CROSS/BLUE SHIELD | Attending: Emergency Medicine | Admitting: Emergency Medicine

## 2016-07-03 DIAGNOSIS — Z7982 Long term (current) use of aspirin: Secondary | ICD-10-CM | POA: Insufficient documentation

## 2016-07-03 DIAGNOSIS — G43909 Migraine, unspecified, not intractable, without status migrainosus: Secondary | ICD-10-CM | POA: Insufficient documentation

## 2016-07-03 DIAGNOSIS — Z79899 Other long term (current) drug therapy: Secondary | ICD-10-CM | POA: Insufficient documentation

## 2016-07-03 DIAGNOSIS — I10 Essential (primary) hypertension: Secondary | ICD-10-CM | POA: Insufficient documentation

## 2016-07-03 DIAGNOSIS — Z87891 Personal history of nicotine dependence: Secondary | ICD-10-CM | POA: Insufficient documentation

## 2016-07-03 MED ORDER — SODIUM CHLORIDE 0.9 % IV BOLUS (SEPSIS)
500.0000 mL | Freq: Once | INTRAVENOUS | Status: AC
  Administered 2016-07-03: 500 mL via INTRAVENOUS

## 2016-07-03 MED ORDER — ONDANSETRON 4 MG PO TBDP
4.0000 mg | ORAL_TABLET | Freq: Once | ORAL | Status: AC | PRN
  Administered 2016-07-03: 4 mg via ORAL

## 2016-07-03 MED ORDER — DIPHENHYDRAMINE HCL 50 MG/ML IJ SOLN
25.0000 mg | Freq: Once | INTRAMUSCULAR | Status: AC
Start: 2016-07-03 — End: 2016-07-03
  Administered 2016-07-03: 25 mg via INTRAVENOUS
  Filled 2016-07-03: qty 1

## 2016-07-03 MED ORDER — KETOROLAC TROMETHAMINE 15 MG/ML IJ SOLN
15.0000 mg | Freq: Once | INTRAMUSCULAR | Status: AC
  Administered 2016-07-03: 15 mg via INTRAVENOUS
  Filled 2016-07-03: qty 1

## 2016-07-03 MED ORDER — ONDANSETRON 4 MG PO TBDP
ORAL_TABLET | ORAL | Status: AC
  Filled 2016-07-03: qty 1

## 2016-07-03 MED ORDER — DEXAMETHASONE SODIUM PHOSPHATE 10 MG/ML IJ SOLN
10.0000 mg | Freq: Once | INTRAMUSCULAR | Status: AC
  Administered 2016-07-03: 10 mg via INTRAVENOUS
  Filled 2016-07-03: qty 1

## 2016-07-03 MED ORDER — METOCLOPRAMIDE HCL 5 MG/ML IJ SOLN
10.0000 mg | Freq: Once | INTRAMUSCULAR | Status: AC
  Administered 2016-07-03: 10 mg via INTRAVENOUS
  Filled 2016-07-03: qty 2

## 2016-07-03 NOTE — ED Triage Notes (Signed)
Pt states that he is having a migraine, has known hx of migraines. Headache has been going on for 2 days, c/o n/v and photosensitivity. Pt took excedrin without relief
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
## 2016-07-03 NOTE — ED Notes (Signed)
papers reviewed with patient and he verbalizes understanding. IV removed

## 2017-04-20 ENCOUNTER — Emergency Department (HOSPITAL_COMMUNITY): Payer: Self-pay

## 2017-04-20 ENCOUNTER — Emergency Department (HOSPITAL_COMMUNITY)
Admission: EM | Admit: 2017-04-20 | Discharge: 2017-04-20 | Discharge status: Against Medical Advice | Payer: Self-pay | Attending: Emergency Medicine | Admitting: Emergency Medicine

## 2017-04-20 ENCOUNTER — Encounter (HOSPITAL_COMMUNITY): Payer: Self-pay | Admitting: *Deleted

## 2017-04-20 DIAGNOSIS — R079 Chest pain, unspecified: Secondary | ICD-10-CM | POA: Insufficient documentation

## 2017-04-20 DIAGNOSIS — Z5321 Procedure and treatment not carried out due to patient leaving prior to being seen by health care provider: Secondary | ICD-10-CM | POA: Insufficient documentation

## 2017-04-20 LAB — BASIC METABOLIC PANEL
Anion gap: 7 (ref 5–15)
BUN: 7 mg/dL (ref 6–20)
CALCIUM: 9.2 mg/dL (ref 8.9–10.3)
CO2: 27 mmol/L (ref 22–32)
CREATININE: 0.79 mg/dL (ref 0.61–1.24)
Chloride: 105 mmol/L (ref 101–111)
GFR calc non Af Amer: >60 mL/min (ref >60)
GLUCOSE: 99 mg/dL (ref 65–99)
Potassium: 4.2 mmol/L (ref 3.5–5.1)
Sodium: 139 mmol/L (ref 135–145)

## 2017-04-20 LAB — CBC
HCT: 42.9 % (ref 39.0–52.0)
Hemoglobin: 14.9 g/dL (ref 13.0–17.0)
MCH: 31.6 pg (ref 26.0–34.0)
MCHC: 34.7 g/dL (ref 30.0–36.0)
MCV: 91.1 fL (ref 78.0–100.0)
PLATELETS: 232 10*3/uL (ref 150–400)
RBC: 4.71 MIL/uL (ref 4.22–5.81)
RDW: 12.8 % (ref 11.5–15.5)
WBC: 8.2 10*3/uL (ref 4.0–10.5)

## 2017-04-20 LAB — I-STAT TROPONIN, ED: Troponin i, poc: 0.01 ng/mL (ref 0.00–0.08)

## 2017-04-20 NOTE — ED Notes (Signed)
Pt. Stated, Im going to go ahead and leave and try coming back in the morning, it makes my BP go up to sit and wait.

## 2017-04-20 NOTE — ED Triage Notes (Signed)
Pt c/o L sided CP onset x 4 days, pt c/o n/v, pt reports x 2  vomiting  Episodes  In the last 24 hrs with SOB with exertion, pt A&O x4

## 2017-04-20 NOTE — ED Notes (Signed)
Pt had to urinate, prior to the EKG. 

## 2017-11-01 IMAGING — DX DG CHEST 2V
2 series · 2 of 2 positions shown · non-contrast
Comparison: 01/19/2014

CLINICAL DATA: Left-sided chest pain

EXAM:
CHEST  2 VIEW

[chest pa]
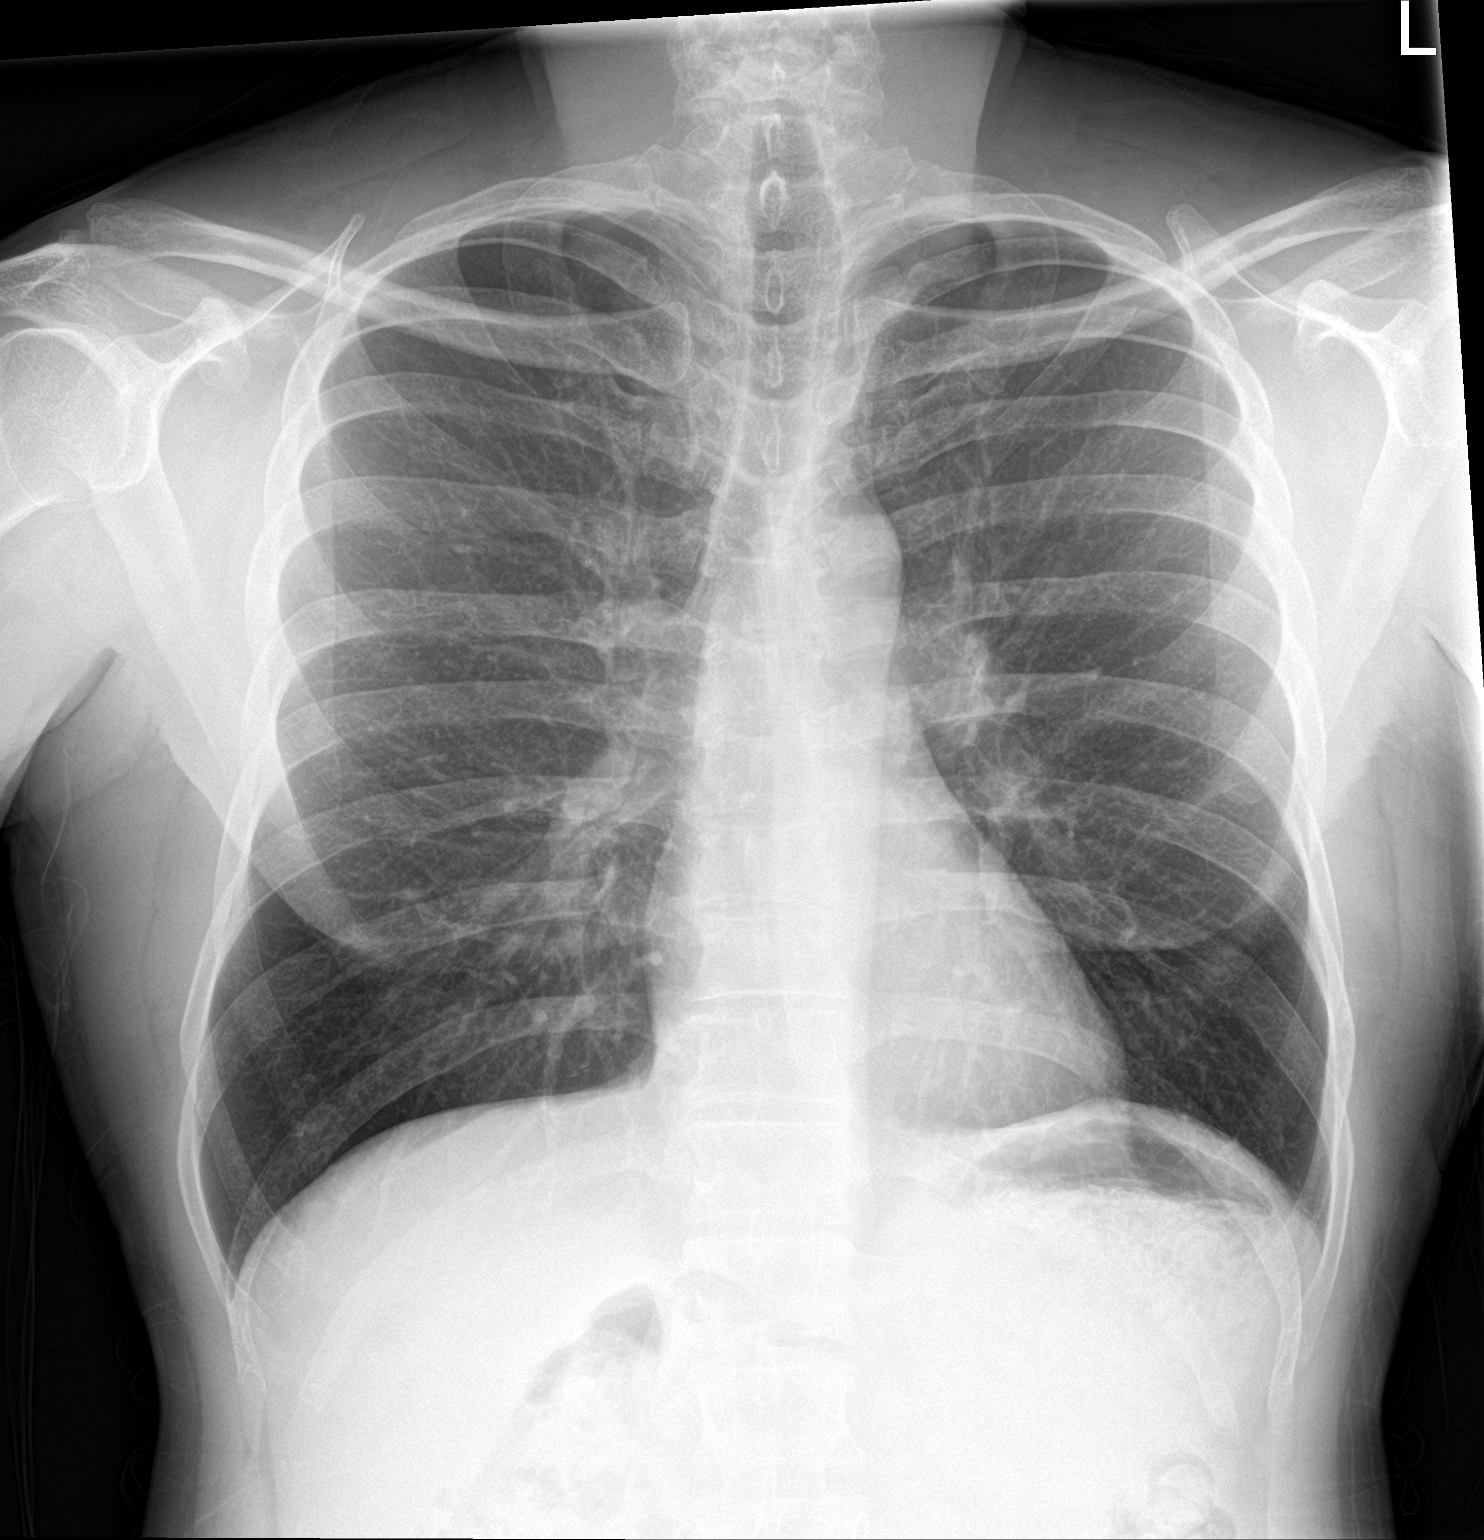

[chest lat]
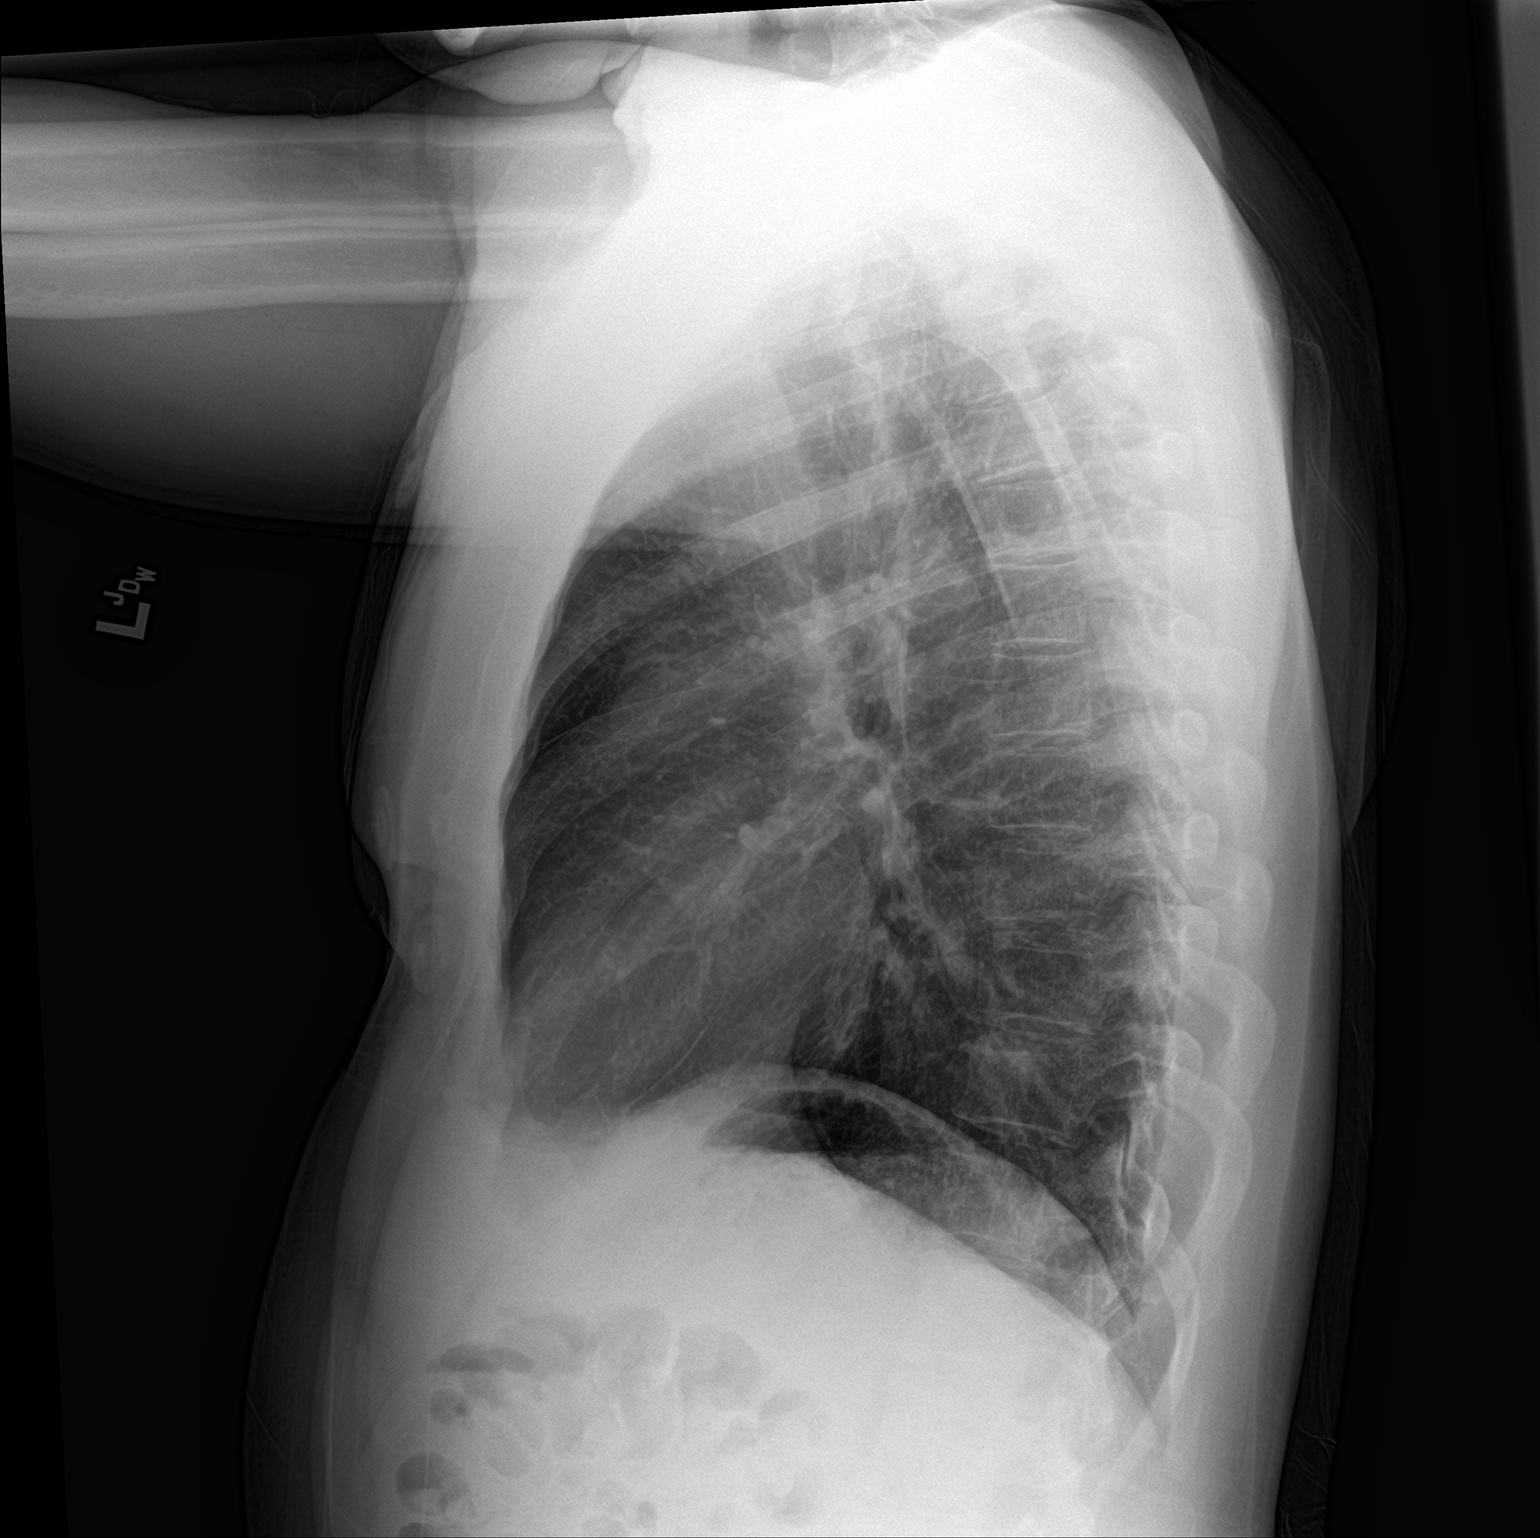

[2 of 2 positions shown; findings below may reference images not displayed]

FINDINGS: The heart size and mediastinal contours are within normal limits.
Both lungs are clear. The visualized skeletal structures are
unremarkable.
IMPRESSION: No active cardiopulmonary disease.

## 2018-01-12 ENCOUNTER — Encounter (HOSPITAL_COMMUNITY): Payer: Self-pay | Admitting: Emergency Medicine

## 2018-01-12 ENCOUNTER — Other Ambulatory Visit: Payer: Self-pay

## 2018-01-12 ENCOUNTER — Emergency Department (HOSPITAL_COMMUNITY)
Admission: EM | Admit: 2018-01-12 | Discharge: 2018-01-13 | Disposition: A | Discharge status: Home / Self Care | Payer: Self-pay | Attending: Emergency Medicine | Admitting: Emergency Medicine

## 2018-01-12 DIAGNOSIS — Z87891 Personal history of nicotine dependence: Secondary | ICD-10-CM | POA: Insufficient documentation

## 2018-01-12 DIAGNOSIS — R519 Headache, unspecified: Secondary | ICD-10-CM

## 2018-01-12 DIAGNOSIS — R51 Headache: Secondary | ICD-10-CM | POA: Insufficient documentation

## 2018-01-12 DIAGNOSIS — Z79899 Other long term (current) drug therapy: Secondary | ICD-10-CM | POA: Insufficient documentation

## 2018-01-12 DIAGNOSIS — I10 Essential (primary) hypertension: Secondary | ICD-10-CM | POA: Insufficient documentation

## 2018-01-12 NOTE — ED Triage Notes (Signed)
Pt c/o migraine that started the middle of 2 nights ago. Pt took Tylenol with no relief. Pt report 5/10 nagging headache. Pt has photophobia and unable to sleep d/t pain. Pt reports hx of migraines.

## 2018-01-13 MED ORDER — SODIUM CHLORIDE 0.9 % IV BOLUS
1000.0000 mL | Freq: Once | INTRAVENOUS | Status: AC
  Administered 2018-01-13: 1000 mL via INTRAVENOUS

## 2018-01-13 MED ORDER — KETOROLAC TROMETHAMINE 30 MG/ML IJ SOLN
30.0000 mg | Freq: Once | INTRAMUSCULAR | Status: AC
  Administered 2018-01-13: 30 mg via INTRAVENOUS
  Filled 2018-01-13: qty 1

## 2018-01-13 MED ORDER — DIPHENHYDRAMINE HCL 50 MG/ML IJ SOLN
12.5000 mg | Freq: Once | INTRAMUSCULAR | Status: AC
  Administered 2018-01-13: 12.5 mg via INTRAVENOUS

## 2018-01-13 MED ORDER — PROCHLORPERAZINE EDISYLATE 10 MG/2ML IJ SOLN
10.0000 mg | Freq: Once | INTRAMUSCULAR | Status: AC
  Administered 2018-01-13: 10 mg via INTRAVENOUS

## 2018-01-13 MED ORDER — METOCLOPRAMIDE HCL 5 MG/ML IJ SOLN
10.0000 mg | INTRAMUSCULAR | Status: AC
  Administered 2018-01-13: 10 mg via INTRAVENOUS
  Filled 2018-01-13: qty 2

## 2018-01-13 MED ORDER — PROCHLORPERAZINE EDISYLATE 10 MG/2ML IJ SOLN
INTRAMUSCULAR | Status: AC
  Filled 2018-01-13: qty 2

## 2018-01-13 MED ORDER — DIPHENHYDRAMINE HCL 50 MG/ML IJ SOLN
INTRAMUSCULAR | Status: AC
  Filled 2018-01-13: qty 1

## 2018-01-13 NOTE — ED Notes (Signed)
Pt departed in NAD, refused use of wheelchair.  
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
## 2018-01-15 ENCOUNTER — Encounter (HOSPITAL_COMMUNITY): Payer: Self-pay | Admitting: *Deleted

## 2018-01-15 ENCOUNTER — Emergency Department (HOSPITAL_COMMUNITY)
Admission: EM | Admit: 2018-01-15 | Discharge: 2018-01-15 | Discharge status: Against Medical Advice | Payer: Self-pay | Attending: Emergency Medicine | Admitting: Emergency Medicine

## 2018-01-15 DIAGNOSIS — R51 Headache: Secondary | ICD-10-CM | POA: Insufficient documentation

## 2018-01-15 DIAGNOSIS — Z79899 Other long term (current) drug therapy: Secondary | ICD-10-CM | POA: Insufficient documentation

## 2018-01-15 DIAGNOSIS — Z87891 Personal history of nicotine dependence: Secondary | ICD-10-CM | POA: Insufficient documentation

## 2018-01-15 DIAGNOSIS — I10 Essential (primary) hypertension: Secondary | ICD-10-CM | POA: Insufficient documentation

## 2018-01-15 DIAGNOSIS — R519 Headache, unspecified: Secondary | ICD-10-CM

## 2018-01-15 MED ORDER — KETOROLAC TROMETHAMINE 30 MG/ML IJ SOLN
30.0000 mg | Freq: Once | INTRAMUSCULAR | Status: DC
Start: 2018-01-15 — End: 2018-01-15

## 2018-01-15 MED ORDER — DIPHENHYDRAMINE HCL 50 MG/ML IJ SOLN: 25.0000 mg | Freq: Once | INTRAMUSCULAR | Status: DC

## 2018-01-15 MED ORDER — METOCLOPRAMIDE HCL 5 MG/ML IJ SOLN: 10.0000 mg | Freq: Once | INTRAMUSCULAR | Status: DC

## 2018-01-15 MED ORDER — SODIUM CHLORIDE 0.9 % IV BOLUS: 1000.0000 mL | Freq: Once | INTRAVENOUS | Status: DC

## 2018-01-15 NOTE — ED Triage Notes (Signed)
Pt DC homme AMA

## 2018-01-15 NOTE — ED Triage Notes (Signed)
PT refuses  IV and meds . Pt reports he wats to go home . T ,PA  Talked with Pt . Pt reported he wants a work note.
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
## 2018-01-15 NOTE — ED Triage Notes (Signed)
Pt in c/o headache that started last night, hx of migraines, reports nausea with this

## 2018-01-15 NOTE — ED Notes (Signed)
Pt is refusing bloodwork and IVs at this time per Dr. Rosalia Hammersay and this tech.

## 2023-01-28 ENCOUNTER — Other Ambulatory Visit (HOSPITAL_BASED_OUTPATIENT_CLINIC_OR_DEPARTMENT_OTHER): Payer: Self-pay

## 2023-01-28 ENCOUNTER — Encounter (HOSPITAL_BASED_OUTPATIENT_CLINIC_OR_DEPARTMENT_OTHER): Payer: Self-pay

## 2023-01-28 ENCOUNTER — Emergency Department (HOSPITAL_BASED_OUTPATIENT_CLINIC_OR_DEPARTMENT_OTHER)
Admission: EM | Admit: 2023-01-28 | Discharge: 2023-01-28 | Disposition: A | Discharge status: Home / Self Care | Payer: Medicaid Other | Attending: Emergency Medicine | Admitting: Emergency Medicine

## 2023-01-28 ENCOUNTER — Other Ambulatory Visit: Payer: Self-pay

## 2023-01-28 ENCOUNTER — Emergency Department (HOSPITAL_BASED_OUTPATIENT_CLINIC_OR_DEPARTMENT_OTHER): Payer: Medicaid Other

## 2023-01-28 ENCOUNTER — Emergency Department (HOSPITAL_BASED_OUTPATIENT_CLINIC_OR_DEPARTMENT_OTHER): Payer: Medicaid Other | Admitting: Radiology

## 2023-01-28 DIAGNOSIS — Z79899 Other long term (current) drug therapy: Secondary | ICD-10-CM | POA: Diagnosis not present

## 2023-01-28 DIAGNOSIS — Y92410 Unspecified street and highway as the place of occurrence of the external cause: Secondary | ICD-10-CM | POA: Insufficient documentation

## 2023-01-28 DIAGNOSIS — I10 Essential (primary) hypertension: Secondary | ICD-10-CM | POA: Insufficient documentation

## 2023-01-28 DIAGNOSIS — R519 Headache, unspecified: Secondary | ICD-10-CM | POA: Insufficient documentation

## 2023-01-28 DIAGNOSIS — H9311 Tinnitus, right ear: Secondary | ICD-10-CM | POA: Insufficient documentation

## 2023-01-28 DIAGNOSIS — M25511 Pain in right shoulder: Secondary | ICD-10-CM | POA: Insufficient documentation

## 2023-01-28 MED ORDER — IBUPROFEN 400 MG PO TABS
600.0000 mg | ORAL_TABLET | Freq: Once | ORAL | Status: AC
  Administered 2023-01-28: 600 mg via ORAL
  Filled 2023-01-28: qty 1
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
## 2023-01-28 NOTE — ED Notes (Signed)
Discharge paperwork given and verbally understood. 
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
## 2023-01-28 NOTE — Discharge Instructions (Signed)
As discussed, workup today overall reassuring.  Imaging of your head and shoulder without abnormality.  Recommend continued treatment of pain at home with Tylenol/Motrin as well as follow-up with primary care for reassessment of your symptoms.  You could have suffered concussive type injury.  See information attached regarding said condition.  Please do not hesitate to return to emergency department for worrisome signs and symptoms we discussed become apparent.

## 2023-07-18 ENCOUNTER — Encounter (HOSPITAL_BASED_OUTPATIENT_CLINIC_OR_DEPARTMENT_OTHER): Payer: Self-pay | Admitting: *Deleted

## 2023-07-18 ENCOUNTER — Emergency Department (HOSPITAL_BASED_OUTPATIENT_CLINIC_OR_DEPARTMENT_OTHER): Payer: Medicaid Other | Admitting: Radiology

## 2023-07-18 ENCOUNTER — Emergency Department (HOSPITAL_BASED_OUTPATIENT_CLINIC_OR_DEPARTMENT_OTHER)
Admission: EM | Admit: 2023-07-18 | Discharge: 2023-07-18 | Disposition: A | Discharge status: Home / Self Care | Payer: Medicaid Other

## 2023-07-18 ENCOUNTER — Emergency Department (HOSPITAL_BASED_OUTPATIENT_CLINIC_OR_DEPARTMENT_OTHER): Payer: Medicaid Other

## 2023-07-18 ENCOUNTER — Other Ambulatory Visit: Payer: Self-pay

## 2023-07-18 DIAGNOSIS — M25572 Pain in left ankle and joints of left foot: Secondary | ICD-10-CM | POA: Insufficient documentation

## 2023-07-18 DIAGNOSIS — M25512 Pain in left shoulder: Secondary | ICD-10-CM | POA: Insufficient documentation

## 2023-07-18 DIAGNOSIS — S299XXA Unspecified injury of thorax, initial encounter: Secondary | ICD-10-CM | POA: Insufficient documentation

## 2023-07-18 DIAGNOSIS — M542 Cervicalgia: Secondary | ICD-10-CM | POA: Insufficient documentation

## 2023-07-18 DIAGNOSIS — S0992XA Unspecified injury of nose, initial encounter: Secondary | ICD-10-CM | POA: Diagnosis present

## 2023-07-18 DIAGNOSIS — Z79899 Other long term (current) drug therapy: Secondary | ICD-10-CM | POA: Insufficient documentation

## 2023-07-18 DIAGNOSIS — S3991XA Unspecified injury of abdomen, initial encounter: Secondary | ICD-10-CM | POA: Insufficient documentation

## 2023-07-18 DIAGNOSIS — Y92410 Unspecified street and highway as the place of occurrence of the external cause: Secondary | ICD-10-CM | POA: Diagnosis not present

## 2023-07-18 DIAGNOSIS — M25562 Pain in left knee: Secondary | ICD-10-CM | POA: Diagnosis not present

## 2023-07-18 DIAGNOSIS — S0083XA Contusion of other part of head, initial encounter: Secondary | ICD-10-CM | POA: Diagnosis not present

## 2023-07-18 DIAGNOSIS — I10 Essential (primary) hypertension: Secondary | ICD-10-CM | POA: Insufficient documentation

## 2023-07-18 DIAGNOSIS — Z23 Encounter for immunization: Secondary | ICD-10-CM | POA: Insufficient documentation

## 2023-07-18 DIAGNOSIS — S022XXA Fracture of nasal bones, initial encounter for closed fracture: Secondary | ICD-10-CM | POA: Diagnosis not present

## 2023-07-18 DIAGNOSIS — M25552 Pain in left hip: Secondary | ICD-10-CM | POA: Diagnosis not present

## 2023-07-18 HISTORY — DX: Sleep apnea, unspecified: G47.30

## 2023-07-18 HISTORY — DX: Gastro-esophageal reflux disease without esophagitis: K21.9

## 2023-07-18 LAB — CBC
HCT: 43.1 % (ref 39.0–52.0)
Hemoglobin: 14.8 g/dL (ref 13.0–17.0)
MCH: 32.1 pg (ref 26.0–34.0)
MCHC: 34.3 g/dL (ref 30.0–36.0)
MCV: 93.5 fL (ref 80.0–100.0)
Platelets: 240 10*3/uL (ref 150–400)
RBC: 4.61 MIL/uL (ref 4.22–5.81)
RDW: 12.6 % (ref 11.5–15.5)
WBC: 11.7 10*3/uL — ABNORMAL HIGH (ref 4.0–10.5)
nRBC: 0 % (ref 0.0–0.2)

## 2023-07-18 LAB — BASIC METABOLIC PANEL
Anion gap: 5 (ref 5–15)
BUN: 14 mg/dL (ref 6–20)
CO2: 27 mmol/L (ref 22–32)
Calcium: 8.6 mg/dL — ABNORMAL LOW (ref 8.9–10.3)
Chloride: 105 mmol/L (ref 98–111)
Creatinine, Ser: 1.12 mg/dL (ref 0.61–1.24)
GFR, Estimated: >60 mL/min (ref >60)
Glucose, Bld: 116 mg/dL — ABNORMAL HIGH (ref 70–99)
Potassium: 4.1 mmol/L (ref 3.5–5.1)
Sodium: 137 mmol/L (ref 135–145)

## 2023-07-18 MED ORDER — OXYCODONE-ACETAMINOPHEN 5-325 MG PO TABS
1.0000 | ORAL_TABLET | Freq: Once | ORAL | Status: AC
  Administered 2023-07-18: 1 via ORAL

## 2023-07-18 MED ORDER — CLINDAMYCIN HCL 300 MG PO CAPS: 300.0000 mg | ORAL_CAPSULE | Freq: Three times a day (TID) | ORAL | 0 refills | Status: AC

## 2023-07-18 MED ORDER — MORPHINE SULFATE (PF) 4 MG/ML IV SOLN: 4.0000 mg | Freq: Once | INTRAVENOUS | Status: DC

## 2023-07-18 MED ORDER — TETANUS-DIPHTH-ACELL PERTUSSIS 5-2.5-18.5 LF-MCG/0.5 IM SUSY
0.5000 mL | PREFILLED_SYRINGE | Freq: Once | INTRAMUSCULAR | Status: AC
  Administered 2023-07-18: 0.5 mL via INTRAMUSCULAR
  Filled 2023-07-18: qty 0.5

## 2023-07-18 MED ORDER — OXYCODONE-ACETAMINOPHEN 5-325 MG PO TABS
1.0000 | ORAL_TABLET | Freq: Once | ORAL | Status: DC
  Filled 2023-07-18: qty 1

## 2023-07-18 MED ORDER — ONDANSETRON HCL 4 MG/2ML IJ SOLN: 4.0000 mg | Freq: Once | INTRAMUSCULAR | Status: DC

## 2023-07-18 MED ORDER — IOHEXOL 300 MG/ML  SOLN
100.0000 mL | Freq: Once | INTRAMUSCULAR | Status: AC | PRN
  Administered 2023-07-18: 100 mL via INTRAVENOUS

## 2023-07-18 MED ORDER — OXYCODONE-ACETAMINOPHEN 5-325 MG PO TABS: 1.0000 | ORAL_TABLET | Freq: Four times a day (QID) | ORAL | 0 refills | Status: AC | PRN

## 2023-07-18 MED ORDER — CLINDAMYCIN HCL 150 MG PO CAPS
300.0000 mg | ORAL_CAPSULE | Freq: Once | ORAL | Status: AC
  Administered 2023-07-18: 300 mg via ORAL
  Filled 2023-07-18: qty 2
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
## 2023-07-18 NOTE — Discharge Instructions (Signed)
 Please take the antibiotics as prescribed.  Take the Percocet as needed for pain.  Do not drive or drink alcohol taking as this may make you drowsy.  Please do not blow your nose until you follow-up with the ENT doctor.  Return to the ER for worsening symptoms.
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
## 2024-02-11 ENCOUNTER — Other Ambulatory Visit: Payer: Self-pay | Admitting: Family Medicine

## 2024-02-11 DIAGNOSIS — M542 Cervicalgia: Secondary | ICD-10-CM

## 2024-02-11 DIAGNOSIS — M25561 Pain in right knee: Secondary | ICD-10-CM

## 2024-02-12 ENCOUNTER — Other Ambulatory Visit (HOSPITAL_COMMUNITY): Payer: Self-pay

## 2024-02-12 MED ORDER — MEGESTROL ACETATE 40 MG PO TABS
40.0000 mg | ORAL_TABLET | Freq: Two times a day (BID) | ORAL | 0 refills | Status: AC
  Filled 2024-02-12: qty 60, 30d supply, fill #0

## 2024-02-22 ENCOUNTER — Inpatient Hospital Stay: Admission: RE | Admit: 2024-02-22 | Source: Ambulatory Visit

## 2024-02-22 ENCOUNTER — Other Ambulatory Visit (HOSPITAL_COMMUNITY): Payer: Self-pay

## 2024-02-22 ENCOUNTER — Other Ambulatory Visit

## 2024-07-25 ENCOUNTER — Other Ambulatory Visit (HOSPITAL_BASED_OUTPATIENT_CLINIC_OR_DEPARTMENT_OTHER): Payer: Self-pay
